# Patient Record
Sex: Male | Born: 2000 | Race: Black or African American | Hispanic: No | Marital: Single | Smoking: Never smoker
Health system: Southern US, Community
[De-identification: ages and names within clinical notes are randomized; demographics above are authoritative.]

## PROBLEM LIST (undated history)

## (undated) DIAGNOSIS — F909 Attention-deficit hyperactivity disorder, unspecified type: Secondary | ICD-10-CM

## (undated) DIAGNOSIS — F29 Unspecified psychosis not due to a substance or known physiological condition: Secondary | ICD-10-CM

## (undated) DIAGNOSIS — F99 Mental disorder, not otherwise specified: Secondary | ICD-10-CM

## (undated) HISTORY — PX: NO PAST SURGERIES: SHX2092

## (undated) HISTORY — PX: WISDOM TOOTH EXTRACTION: SHX21

---
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
## 2021-12-17 ENCOUNTER — Emergency Department (HOSPITAL_COMMUNITY): Payer: Medicaid Other

## 2021-12-17 ENCOUNTER — Inpatient Hospital Stay (HOSPITAL_COMMUNITY): Payer: Medicaid Other

## 2021-12-17 ENCOUNTER — Other Ambulatory Visit: Payer: Self-pay

## 2021-12-17 ENCOUNTER — Inpatient Hospital Stay (HOSPITAL_COMMUNITY)
Admission: EM | Admit: 2021-12-17 | Discharge: 2021-12-19 | DRG: 101 | Disposition: A | Discharge status: Home / Self Care | Payer: Medicaid Other | Attending: Internal Medicine | Admitting: Internal Medicine

## 2021-12-17 DIAGNOSIS — F29 Unspecified psychosis not due to a substance or known physiological condition: Secondary | ICD-10-CM | POA: Diagnosis present

## 2021-12-17 DIAGNOSIS — R1110 Vomiting, unspecified: Secondary | ICD-10-CM | POA: Diagnosis not present

## 2021-12-17 DIAGNOSIS — Z888 Allergy status to other drugs, medicaments and biological substances status: Secondary | ICD-10-CM

## 2021-12-17 DIAGNOSIS — R569 Unspecified convulsions: Principal | ICD-10-CM

## 2021-12-17 DIAGNOSIS — F1210 Cannabis abuse, uncomplicated: Secondary | ICD-10-CM

## 2021-12-17 DIAGNOSIS — Z818 Family history of other mental and behavioral disorders: Secondary | ICD-10-CM

## 2021-12-17 DIAGNOSIS — R270 Ataxia, unspecified: Secondary | ICD-10-CM | POA: Diagnosis present

## 2021-12-17 DIAGNOSIS — S22000A Wedge compression fracture of unspecified thoracic vertebra, initial encounter for closed fracture: Secondary | ICD-10-CM

## 2021-12-17 DIAGNOSIS — M4854XA Collapsed vertebra, not elsewhere classified, thoracic region, initial encounter for fracture: Secondary | ICD-10-CM | POA: Diagnosis present

## 2021-12-17 DIAGNOSIS — F419 Anxiety disorder, unspecified: Secondary | ICD-10-CM | POA: Diagnosis present

## 2021-12-17 DIAGNOSIS — F902 Attention-deficit hyperactivity disorder, combined type: Secondary | ICD-10-CM | POA: Diagnosis present

## 2021-12-17 DIAGNOSIS — Z79899 Other long term (current) drug therapy: Secondary | ICD-10-CM | POA: Diagnosis not present

## 2021-12-17 LAB — COMPREHENSIVE METABOLIC PANEL
ALT: 22 U/L (ref 0–44)
AST: 28 U/L (ref 15–41)
Albumin: 4.8 g/dL (ref 3.5–5.0)
Alkaline Phosphatase: 64 U/L (ref 38–126)
Anion gap: 12 (ref 5–15)
BUN: 12 mg/dL (ref 6–20)
CO2: 20 mmol/L — ABNORMAL LOW (ref 22–32)
Calcium: 9.3 mg/dL (ref 8.9–10.3)
Chloride: 104 mmol/L (ref 98–111)
Creatinine, Ser: 0.95 mg/dL (ref 0.61–1.24)
GFR, Estimated: >60 mL/min (ref >60)
Glucose, Bld: 85 mg/dL (ref 70–99)
Potassium: 3.8 mmol/L (ref 3.5–5.1)
Sodium: 136 mmol/L (ref 135–145)
Total Bilirubin: 0.7 mg/dL (ref 0.3–1.2)
Total Protein: 8.4 g/dL — ABNORMAL HIGH (ref 6.5–8.1)

## 2021-12-17 LAB — URINALYSIS, ROUTINE W REFLEX MICROSCOPIC
Bilirubin Urine: NEGATIVE
Glucose, UA: NEGATIVE mg/dL
Ketones, ur: NEGATIVE mg/dL
Leukocytes,Ua: NEGATIVE
Nitrite: NEGATIVE
Protein, ur: NEGATIVE mg/dL
Specific Gravity, Urine: 1.009 (ref 1.005–1.030)
pH: 6 (ref 5.0–8.0)

## 2021-12-17 LAB — CBC
HCT: 46.2 % (ref 39.0–52.0)
Hemoglobin: 15.2 g/dL (ref 13.0–17.0)
MCH: 30 pg (ref 26.0–34.0)
MCHC: 32.9 g/dL (ref 30.0–36.0)
MCV: 91.3 fL (ref 80.0–100.0)
Platelets: 347 10*3/uL (ref 150–400)
RBC: 5.06 MIL/uL (ref 4.22–5.81)
RDW: 12.6 % (ref 11.5–15.5)
WBC: 5.8 10*3/uL (ref 4.0–10.5)
nRBC: 0 % (ref 0.0–0.2)

## 2021-12-17 LAB — RAPID URINE DRUG SCREEN, HOSP PERFORMED
Amphetamines: NOT DETECTED
Barbiturates: NOT DETECTED
Benzodiazepines: NOT DETECTED
Cocaine: NOT DETECTED
Opiates: NOT DETECTED
Tetrahydrocannabinol: POSITIVE — AB

## 2021-12-17 LAB — CBG MONITORING, ED: Glucose-Capillary: 110 mg/dL — ABNORMAL HIGH (ref 70–99)

## 2021-12-17 LAB — LIPASE, BLOOD: Lipase: 28 U/L (ref 11–51)

## 2021-12-17 MED ORDER — IBUPROFEN 200 MG PO TABS
600.0000 mg | ORAL_TABLET | Freq: Once | ORAL | Status: AC
  Administered 2021-12-17: 600 mg via ORAL
  Filled 2021-12-17: qty 3

## 2021-12-17 MED ORDER — LORAZEPAM 2 MG/ML IJ SOLN
INTRAMUSCULAR | Status: AC
  Administered 2021-12-17: 2 mg
  Filled 2021-12-17: qty 1

## 2021-12-17 MED ORDER — ACETAMINOPHEN 325 MG PO TABS
650.0000 mg | ORAL_TABLET | Freq: Once | ORAL | Status: AC
  Administered 2021-12-17: 650 mg via ORAL
  Filled 2021-12-17: qty 2

## 2021-12-17 MED ORDER — SODIUM CHLORIDE 0.9 % IV SOLN: Freq: Once | INTRAVENOUS | Status: AC

## 2021-12-17 MED ORDER — LEVETIRACETAM 500 MG/5ML IV SOLN
60.0000 mg/kg | Freq: Once | INTRAVENOUS | Status: AC
  Administered 2021-12-17: 3400 mg via INTRAVENOUS
  Filled 2021-12-17: qty 34

## 2021-12-17 MED ORDER — LORAZEPAM 2 MG/ML IJ SOLN
1.0000 mg | INTRAMUSCULAR | Status: AC
  Administered 2021-12-17: 1 mg via INTRAVENOUS
  Filled 2021-12-17: qty 1

## 2021-12-17 NOTE — ED Notes (Signed)
Attempted to call report to IP RN at [facility]. Told by staff that there is a linen shortage and they would call back when beds are made. ?

## 2021-12-17 NOTE — ED Triage Notes (Signed)
Spoke w/pt's family. They saw pt contract and go unresponsive for approx 1 min while in chair. Pt suffered no fall When pt came to they were extremely confused. ?

## 2021-12-17 NOTE — Progress Notes (Signed)
Patient ID: Sean Calderon, male   DOB: [birth year], 21 y.o.   MRN: [id number] ?CT scan reviewed? 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  No pathologic signs on CT imaging however I do feel like maybe is worth doing an MRI scan to help stage and age these compression fractures but also the patient should be put through an extensive work-up for some kind of underlying metabolic disorder that would make him osteopenic or have poor bone density at 21 years old. ?

## 2021-12-17 NOTE — ED Notes (Signed)
[facility] called for pt. [facility] to [facility] ?

## 2021-12-17 NOTE — Progress Notes (Signed)
Reached out to CT and nurse. Patient having difficult time staying still enough for 2nd CT. EEG will attempt again once CT has finished with patient.  ?

## 2021-12-17 NOTE — H&P (Signed)
?History and Physical  ? ? ?Patient: Sean Calderon [id number] DOB: Sep 20, 2000 ?DOA: 12/17/2021 ?DOS: the patient was seen and examined on 12/17/2021 ?PCP: Jacelyn Pi, NP  ?Patient coming from: Home ? ?Chief Complaint:  ?Chief Complaint  ?Patient presents with  ? Abdominal Pain  ? Seizures  ? ?HPI: Sean Calderon is a 21 y.o. male with medical history significant of psychosis, anxiety. Presenting with altered mental status. History is from mother at bedside. She reports that this morning his brother noticed that the patient was shaking and drooling. He sounded like he was choking. The brother put the patient on his side and called for his mother. When she saw him her was shaking. He was not responsive to verbal command. He was not incontinent of bowel or bladder. She called for EMS. She reports that he has seemed more fidgety and not himself recently. He did not complain of any fevers, headache, N/V/D prior to this event.  ? ?Review of Systems: unable to review all systems due to the inability of the patient to answer questions. ?Past Medical History:  ?Diagnosis Date  ? ADHD (attention deficit hyperactivity disorder)   ? Mental disorder   ? Psychosis ([facility])   ? ?Past Surgical History:  ?Procedure Laterality Date  ? NO PAST SURGERIES    ? ?Social History:  reports that he has never smoked. He has never used smokeless tobacco. He reports that he does not drink alcohol and does not use drugs. ? ?Allergies  ?Allergen Reactions  ? Bupropion Other (See Comments)  ?  seizure  ? ? ?Family History  ?Problem Relation Age of Onset  ? Migraines Brother   ? Seizures Neg Hx   ? Depression Neg Hx   ? Anxiety disorder Neg Hx   ? ADD / ADHD Neg Hx   ? Autism Neg Hx   ? ? ?Prior to Admission medications   ?Medication Sig Start Date End Date Taking? Authorizing Provider  ?WAL-DRYL ALLERGY 25 MG tablet Take 25 mg by mouth daily as needed for itching or allergies. 09/03/21  Yes [provider]  ?b complex vitamins  tablet Take 1 tablet by mouth daily. ?Patient not taking: Reported on 08/23/2018 06/19/18   Teressa Lower, MD  ?buPROPion (WELLBUTRIN XL) 150 MG 24 hr tablet Take 150 mg by mouth every morning. 12/12/21   [provider]  ?cyproheptadine (PERIACTIN) 4 MG tablet Take 2 tablets (8 mg total) by mouth at bedtime. ?Patient not taking: Reported on 12/17/2021 12/28/18   Teressa Lower, MD  ?ibuprofen (ADVIL,MOTRIN) 100 MG/5ML suspension Take 18 mLs (360 mg total) by mouth every 6 (six) hours as needed for pain or fever. ?Patient not taking: Reported on 06/19/2018 04/16/13   Isaac Bliss, MD  ?Magnesium Oxide 500 MG TABS Take 1 tablet (500 mg total) by mouth daily. ?Patient not taking: Reported on 12/28/2018 06/19/18   Teressa Lower, MD  ? ? ?Physical Exam: ?Vitals:  ? 12/17/21 1355 12/17/21 1400 12/17/21 1430 12/17/21 1503  ?BP: 102/60 112/71 (!) 130/113 124/73  ?Pulse: 74 82 (!) 114 (!) 115  ?Resp: 18 18 18 20   ?Temp:      ?TempSrc:      ?SpO2: 98% 100% 92% 98%  ?Weight:      ?Height:      ? ?General: 21 y.o. male resting in bed in NAD ?Eyes: PERRL, normal sclera ?ENMT: Nares patent w/o discharge, orophaynx clear, dentition normal, ears w/o discharge/lesions/ulcers ?Neck: Supple, trachea midline ?Cardiovascular: RRR, +S1, S2, no m/g/r, equal  pulses throughout ?Respiratory: CTABL, no w/r/r, normal WOB ?GI: BS+, NDNT, no masses noted, no organomegaly noted ?MSK: No e/c/c ?Neuro: responsive to noxious stim, drowsy, not following directions ? ?Data Reviewed: ? ?Na+  136 ?Glucose  85 ?CO2  20 ?WBC  5.8 ? ?CTH: No acute intracranial findings are seen in noncontrast CT brain. ?CT thoracic spine: Age-indeterminate but not definitely acute multiple compression fractures. Height loss is greatest at T6 and T7 measuring about 50%. There is no significant osseous retropulsion. ? ?EKG: sinus, no st elevations ? ?Assessment and Plan: ?No notes have been filed under this hospital service. ?Service: Hospitalist ?Seizures ?    -  admit to inpt, tele @ [facility] ?    - chart review shows he had a presumed seizure back in 2019; this was followed up with pediatric neuro at the time and he was Dx with tension type headaches and syncope ?    - he's on welbutrin for anxiety; this could contribute ?    - EEG pending ?    - neurology consulted; appreciate assistance ?    - keppra ordered; defer further dosing and anti-epileptics to neurology ?    - check MRI brain ? ??compression fracture of T6-T7 ?    - as noted on CT ?    - neurosurgery has reviewed; no acute neurosurgical intervention required; although they question why his bones are so thin at 21 years of age ?    - check vitamin d levels ? ?ADHD ?Psychosis ?    - holding current regimen ? ?Marijuana abuse ?    - counsel against further use ? ?Advance Care Planning:   Code Status: FULL ? ?Consults: Neurology, Neurosurgery ? ?Family Communication: w/ mother at bedside ? ?Severity of Illness: ?The appropriate patient status for this patient is INPATIENT. Inpatient status is judged to be reasonable and necessary in order to provide the required intensity of service to ensure the patient's safety. The patient's presenting symptoms, physical exam findings, and initial radiographic and laboratory data in the context of their chronic comorbidities is felt to place them at high risk for further clinical deterioration. Furthermore, it is not anticipated that the patient will be medically stable for discharge from the hospital within 2 midnights of admission.  ? ?* I certify that at the point of admission it is my clinical judgment that the patient will require inpatient hospital care spanning beyond 2 midnights from the point of admission due to high intensity of service, high risk for further deterioration and high frequency of surveillance required.* ? ?Author: ?Jonnie Finner, DO ?12/17/2021 3:51 PM ? ?For on call review [email or web address].  ?

## 2021-12-17 NOTE — ED Provider Notes (Signed)
?[facility] DEPT ?Provider Note ? ? ?CSN: [id number] ?Arrival date & time: 12/17/21  1018 ? ?  ? ?History ? ?Chief Complaint  ?Patient presents with  ? Abdominal Pain  ? Seizures  ? ? ?Sean Calderon is a 21 y.o. male. ? ?HPI ?21 year old male presents with a seizure.  History initially from the patient but he does remember what happened and so I discussed with his mom who notes that the patient had a witnessed tonic-clonic seizure for about 5 minutes.  His brother noted the patient started acting odd and then started shaking and stiffening.  When he first awoke he was confused.  The patient remembers sitting on the couch with his earbuds in and then waking up in the ambulance.  Since being in the ambulance he is having some back and abdominal pain whenever he moves but if he stays still he feels fine.  No prior history of seizures according to the patient and mom.  He does have a prior history of chronic headaches though this is improving.  Nurse note from EMS indicates a severe headache but the patient denies having a headache this morning. ? ?He has been sleeping very poorly over the last week or so.  He has had a lot of stress due to college.  He has been on Wellbutrin since about November for anxiety. No recent illness. ? ?Home Medications ?Prior to Admission medications   ?Medication Sig Start Date End Date Taking? Authorizing Provider  ?WAL-DRYL ALLERGY 25 MG tablet Take 25 mg by mouth daily as needed for itching or allergies. 09/03/21  Yes [provider]  ?b complex vitamins tablet Take 1 tablet by mouth daily. ?Patient not taking: Reported on 08/23/2018 06/19/18   Teressa Lower, MD  ?buPROPion (WELLBUTRIN XL) 150 MG 24 hr tablet Take 150 mg by mouth every morning. 12/12/21   [provider]  ?cyproheptadine (PERIACTIN) 4 MG tablet Take 2 tablets (8 mg total) by mouth at bedtime. ?Patient not taking: Reported on 12/17/2021 12/28/18   Teressa Lower, MD  ?ibuprofen  (ADVIL,MOTRIN) 100 MG/5ML suspension Take 18 mLs (360 mg total) by mouth every 6 (six) hours as needed for pain or fever. ?Patient not taking: Reported on 06/19/2018 04/16/13   Isaac Bliss, MD  ?Magnesium Oxide 500 MG TABS Take 1 tablet (500 mg total) by mouth daily. ?Patient not taking: Reported on 12/28/2018 06/19/18   Teressa Lower, MD  ?   ? ?Allergies    ?Bupropion   ? ?Review of Systems   ?Review of Systems  ?Constitutional:  Negative for fever.  ?Respiratory:  Negative for shortness of breath.   ?Cardiovascular:  Negative for chest pain.  ?Gastrointestinal:  Positive for abdominal pain. Negative for nausea and vomiting.  ?Musculoskeletal:  Positive for back pain.  ?Neurological:  Positive for seizures. Negative for weakness, numbness and headaches.  ? ?Physical Exam ?Updated Vital Signs ?BP 124/73   Pulse (!) 115   Temp 98.6 ?F (37 ?C) (Oral)   Resp 20   Ht 5\' 4"  (1.626 m)   Wt 56.7 kg   SpO2 98%   BMI 21.46 kg/m?  ?Physical Exam ?Vitals and nursing note reviewed.  ?Constitutional:   ?   Appearance: He is well-developed.  ?HENT:  ?   Head: Normocephalic and atraumatic.  ?Eyes:  ?   Extraocular Movements: Extraocular movements intact.  ?   Pupils: Pupils are equal, round, and reactive to light.  ?Cardiovascular:  ?   Rate and Rhythm: Normal  rate and regular rhythm.  ?   Heart sounds: Normal heart sounds.  ?Pulmonary:  ?   Effort: Pulmonary effort is normal.  ?   Breath sounds: Normal breath sounds.  ?Abdominal:  ?   Palpations: Abdomen is soft.  ?   Tenderness: There is no abdominal tenderness.  ?Musculoskeletal:  ?   Cervical back: Normal range of motion. No rigidity.  ?   Comments: Patient reports midthoracic back pain when I sit him up but I am unable to reproduce this.  He also has abdominal discomfort upon sitting up but I also cannot reproduce this.  No CVA tenderness.  ?Skin: ?   General: Skin is warm and dry.  ?Neurological:  ?   Mental Status: He is alert and oriented to person, place, and  time.  ?   Comments: CN 3-12 grossly intact. 5/5 strength in all 4 extremities. Grossly normal sensation. Normal finger to nose.   ? ? ?ED Results / Procedures / Treatments   ?Labs ?(all labs ordered are listed, but only abnormal results are displayed) ?Labs Reviewed  ?COMPREHENSIVE METABOLIC PANEL - Abnormal; Notable for the following components:  ?    Result Value  ? CO2 20 (*)   ? Total Protein 8.4 (*)   ? All other components within normal limits  ?URINALYSIS, ROUTINE W REFLEX MICROSCOPIC - Abnormal; Notable for the following components:  ? Color, Urine STRAW (*)   ? Hgb urine dipstick SMALL (*)   ? Bacteria, UA RARE (*)   ? All other components within normal limits  ?RAPID URINE DRUG SCREEN, HOSP PERFORMED - Abnormal; Notable for the following components:  ? Tetrahydrocannabinol POSITIVE (*)   ? All other components within normal limits  ?CBG MONITORING, ED - Abnormal; Notable for the following components:  ? Glucose-Capillary 110 (*)   ? All other components within normal limits  ?LIPASE, BLOOD  ?CBC  ? ? ?EKG ?EKG Interpretation ? ?Date/Time:  Friday December 17 2021 11:31:43 EDT ?Ventricular Rate:  82 ?PR Interval:  172 ?QRS Duration: 83 ?QT Interval:  355 ?QTC Calculation: 415 ?R Axis:   72 ?Text Interpretation: Sinus rhythm LVH by voltage no significant change since 2014 Confirmed by Sherwood Gambler [phone]) on 12/17/2021 12:51:45 PM ? ?Radiology ?DG Thoracic Spine W/Swimmers ? ?Result Date: 12/17/2021 ?CLINICAL DATA:  Fall, upper abdominal pain radiating to the back EXAM: THORACIC SPINE - 3 VIEWS COMPARISON:  None. FINDINGS: Mild vertebral body height loss of 2 midthoracic vertebral segments, approximately T6 and T7. Normal alignment. Disc heights are preserved. IMPRESSION: Mild vertebral body height loss of two adjacent midthoracic vertebral segments, approximately T6 and T7. Correlate with point tenderness. Electronically Signed   By: [name] D.O.   On: 12/17/2021 13:56  ? ?CT Head Wo  Contrast ? ?Result Date: 12/17/2021 ?CLINICAL DATA:  Seizures EXAM: CT HEAD WITHOUT CONTRAST TECHNIQUE: Contiguous axial images were obtained from the base of the skull through the vertex without intravenous contrast. RADIATION DOSE REDUCTION: This exam was performed according to the departmental dose-optimization program which includes automated exposure control, adjustment of the mA and/or kV according to patient size and/or use of iterative reconstruction technique. COMPARISON:  Images of previous study done on 05/23/2018 are not available for comparison at the time of this report. Report for the previous study was reviewed. FINDINGS: Brain: No acute intracranial findings are seen. Ventricles are not dilated. There is no shift of midline structures. There are no epidural or subdural fluid collections. Vascular: Unremarkable. Skull: Unremarkable.  Sinuses/Orbits: There is mild mucosal thickening in the ethmoid sinus. Other: None IMPRESSION: No acute intracranial findings are seen in noncontrast CT brain. Electronically Signed   By: Elmer Picker M.D.   On: 12/17/2021 15:04  ? ?DG Chest Portable 1 View ? ?Result Date: 12/17/2021 ?CLINICAL DATA:  Chest pain EXAM: PORTABLE CHEST 1 VIEW COMPARISON:  Chest x-ray 04/16/2013 FINDINGS: Heart size and mediastinal contours are within normal limits. No suspicious pulmonary opacities identified. No pleural effusion or pneumothorax visualized. No acute osseous abnormality appreciated. IMPRESSION: No acute intrathoracic process identified. Electronically Signed   By: Ofilia Neas M.D.   On: 12/17/2021 12:12   ? ?Procedures ?Marland KitchenCritical Care ?Performed by: Sherwood Gambler, MD ?Authorized by: Sherwood Gambler, MD  ? ?Critical care provider statement:  ?  Critical care time (minutes):  30 ?  Critical care time was exclusive of:  Separately billable procedures and treating other patients ?  Critical care was necessary to treat or prevent imminent or life-threatening  deterioration of the following conditions:  CNS failure or compromise ?  Critical care was time spent personally by me on the following activities:  Development of treatment plan with patient or surrogate, discussions with consultant

## 2021-12-17 NOTE — ED Triage Notes (Signed)
Pt BIBA from home. Per EMS pt c/o severe HA, then fell onto couch and began being really confused, not knowing what was going on. Pupils = and reactive. In EMS pt began c/o severe upper abd px that radiates to back. ? ?Hx of THS use. ? ?Upon arrival pt Aox4, and no c/o HA ? ?

## 2021-12-17 NOTE — ED Notes (Signed)
Report called to Ebony Cargo, Charity fundraiser at [facility]. ?

## 2021-12-17 NOTE — Progress Notes (Signed)
Pt unable to cooperate w EEG. Tech had leads on multiple times and patient was rolling in bed, mom attempted to help restrain but patient was able to still move. EEG unsuccessful. Dr. Roda Shutters notified.  ?

## 2021-12-17 NOTE — ED Provider Notes (Signed)
Results for orders placed or performed during the hospital encounter of 12/17/21  ?Lipase, blood  ?Result Value Ref Range  ? Lipase 28 11 - 51 U/L  ?Comprehensive metabolic panel  ?Result Value Ref Range  ? Sodium 136 135 - 145 mmol/L  ? Potassium 3.8 3.5 - 5.1 mmol/L  ? Chloride 104 98 - 111 mmol/L  ? CO2 20 (L) 22 - 32 mmol/L  ? Glucose, Bld 85 70 - 99 mg/dL  ? BUN 12 6 - 20 mg/dL  ? Creatinine, Ser 0.95 0.61 - 1.24 mg/dL  ? Calcium 9.3 8.9 - 10.3 mg/dL  ? Total Protein 8.4 (H) 6.5 - 8.1 g/dL  ? Albumin 4.8 3.5 - 5.0 g/dL  ? AST 28 15 - 41 U/L  ? ALT 22 0 - 44 U/L  ? Alkaline Phosphatase 64 38 - 126 U/L  ? Total Bilirubin 0.7 0.3 - 1.2 mg/dL  ? GFR, Estimated >60 >60 mL/min  ? Anion gap 12 5 - 15  ?CBC  ?Result Value Ref Range  ? WBC 5.8 4.0 - 10.5 K/uL  ? RBC 5.06 4.22 - 5.81 MIL/uL  ? Hemoglobin 15.2 13.0 - 17.0 g/dL  ? HCT 46.2 39.0 - 52.0 %  ? MCV 91.3 80.0 - 100.0 fL  ? MCH 30.0 26.0 - 34.0 pg  ? MCHC 32.9 30.0 - 36.0 g/dL  ? RDW 12.6 11.5 - 15.5 %  ? Platelets 347 150 - 400 K/uL  ? nRBC 0.0 0.0 - 0.2 %  ?Urinalysis, Routine w reflex microscopic  ?Result Value Ref Range  ? Color, Urine STRAW (A) YELLOW  ? APPearance CLEAR CLEAR  ? Specific Gravity, Urine 1.009 1.005 - 1.030  ? pH 6.0 5.0 - 8.0  ? Glucose, UA NEGATIVE NEGATIVE mg/dL  ? Hgb urine dipstick SMALL (A) NEGATIVE  ? Bilirubin Urine NEGATIVE NEGATIVE  ? Ketones, ur NEGATIVE NEGATIVE mg/dL  ? Protein, ur NEGATIVE NEGATIVE mg/dL  ? Nitrite NEGATIVE NEGATIVE  ? Leukocytes,Ua NEGATIVE NEGATIVE  ? RBC / HPF 0-5 0 - 5 RBC/hpf  ? WBC, UA 0-5 0 - 5 WBC/hpf  ? Bacteria, UA RARE (A) NONE SEEN  ? Mucus PRESENT   ? Hyaline Casts, UA PRESENT   ?Rapid urine drug screen (hospital performed)  ?Result Value Ref Range  ? Opiates NONE DETECTED NONE DETECTED  ? Cocaine NONE DETECTED NONE DETECTED  ? Benzodiazepines NONE DETECTED NONE DETECTED  ? Amphetamines NONE DETECTED NONE DETECTED  ? Tetrahydrocannabinol POSITIVE (A) NONE DETECTED  ? Barbiturates NONE DETECTED NONE  DETECTED  ?CBG monitoring, ED  ?Result Value Ref Range  ? Glucose-Capillary 110 (H) 70 - 99 mg/dL  ? ?DG Thoracic Spine W/Swimmers ? ?Result Date: 12/17/2021 ?CLINICAL DATA:  Fall, upper abdominal pain radiating to the back EXAM: THORACIC SPINE - 3 VIEWS COMPARISON:  None. FINDINGS: Mild vertebral body height loss of 2 midthoracic vertebral segments, approximately T6 and T7. Normal alignment. Disc heights are preserved. IMPRESSION: Mild vertebral body height loss of two adjacent midthoracic vertebral segments, approximately T6 and T7. Correlate with point tenderness. Electronically Signed   By: Duanne Guess D.O.   On: 12/17/2021 13:56  ? ?CT Head Wo Contrast ? ?Result Date: 12/17/2021 ?CLINICAL DATA:  Seizures EXAM: CT HEAD WITHOUT CONTRAST TECHNIQUE: Contiguous axial images were obtained from the base of the skull through the vertex without intravenous contrast. RADIATION DOSE REDUCTION: This exam was performed according to the departmental dose-optimization program which includes automated exposure control, adjustment of the mA and/or kV  according to patient size and/or use of iterative reconstruction technique. COMPARISON:  Images of previous study done on 05/23/2018 are not available for comparison at the time of this report. Report for the previous study was reviewed. FINDINGS: Brain: No acute intracranial findings are seen. Ventricles are not dilated. There is no shift of midline structures. There are no epidural or subdural fluid collections. Vascular: Unremarkable. Skull: Unremarkable. Sinuses/Orbits: There is mild mucosal thickening in the ethmoid sinus. Other: None IMPRESSION: No acute intracranial findings are seen in noncontrast CT brain. Electronically Signed   By: Ernie Avena M.D.   On: 12/17/2021 15:04  ? ?CT Thoracic Spine Wo Contrast ? ?Result Date: 12/17/2021 ?CLINICAL DATA:  Back trauma, no prior imaging (Age >= 16y) EXAM: CT THORACIC SPINE WITHOUT CONTRAST TECHNIQUE: Multidetector CT  images of the thoracic were obtained using the standard protocol without intravenous contrast. RADIATION DOSE REDUCTION: This exam was performed according to the departmental dose-optimization program which includes automated exposure control, adjustment of the mA and/or kV according to patient size and/or use of iterative reconstruction technique. COMPARISON:  None. FINDINGS: Alignment: No significant listhesis. Vertebrae: There are multilevel compression fractures. There is moderate, about 50% loss of height at the superior endplates of T6 and T7. Variable but less than 50% loss of height is present at the superior endplates of T3, T4, T5, T8, T9, and T10. These are age-indeterminate. There is no significant osseous retropulsion. No destructive osseous lesion. Paraspinal and other soft tissues: Unremarkable. Disc levels: Intervertebral disc heights are maintained. There is no significant canal or foraminal narrowing identified. IMPRESSION: Age-indeterminate but not definitely acute multiple compression fractures. Height loss is greatest at T6 and T7 measuring about 50%. There is no significant osseous retropulsion. Electronically Signed   By: Guadlupe Spanish M.D.   On: 12/17/2021 15:54  ? ?DG Chest Portable 1 View ? ?Result Date: 12/17/2021 ?CLINICAL DATA:  Chest pain EXAM: PORTABLE CHEST 1 VIEW COMPARISON:  Chest x-ray 04/16/2013 FINDINGS: Heart size and mediastinal contours are within normal limits. No suspicious pulmonary opacities identified. No pleural effusion or pneumothorax visualized. No acute osseous abnormality appreciated. IMPRESSION: No acute intrathoracic process identified. Electronically Signed   By: Jannifer Hick M.D.   On: 12/17/2021 12:12   ? ?Discussed with Dr. Wynetta Emery from neurosurgery.  Is not sure what to make of this.  But he is very concerned about some sort of metabolic or endocrine abnormality with this person recommends work-up for bone density may be also recommend may be getting MRI  may be sed rate etc. we will discussed with the hospitalist admitting team seems to be mostly of a medical problem.  Neurosurgery says the findings are very concerning for some bite is 21 years old.  But does not see anything acute that they need to do anything with at this time. ?  ?Vanetta Mulders, MD ?12/17/21 1625 ? ?

## 2021-12-18 ENCOUNTER — Inpatient Hospital Stay (HOSPITAL_COMMUNITY): Payer: Medicaid Other

## 2021-12-18 ENCOUNTER — Encounter (HOSPITAL_COMMUNITY): Payer: Self-pay | Admitting: Internal Medicine

## 2021-12-18 DIAGNOSIS — R270 Ataxia, unspecified: Secondary | ICD-10-CM

## 2021-12-18 LAB — COMPREHENSIVE METABOLIC PANEL
ALT: 24 U/L (ref 0–44)
AST: 36 U/L (ref 15–41)
Albumin: 4.3 g/dL (ref 3.5–5.0)
Alkaline Phosphatase: 63 U/L (ref 38–126)
Anion gap: 9 (ref 5–15)
BUN: 8 mg/dL (ref 6–20)
CO2: 22 mmol/L (ref 22–32)
Calcium: 9.1 mg/dL (ref 8.9–10.3)
Chloride: 107 mmol/L (ref 98–111)
Creatinine, Ser: 1.06 mg/dL (ref 0.61–1.24)
GFR, Estimated: >60 mL/min (ref >60)
Glucose, Bld: 63 mg/dL — ABNORMAL LOW (ref 70–99)
Potassium: 3.4 mmol/L — ABNORMAL LOW (ref 3.5–5.1)
Sodium: 138 mmol/L (ref 135–145)
Total Bilirubin: 1.1 mg/dL (ref 0.3–1.2)
Total Protein: 7.4 g/dL (ref 6.5–8.1)

## 2021-12-18 LAB — VITAMIN D 25 HYDROXY (VIT D DEFICIENCY, FRACTURES): Vit D, 25-Hydroxy: 29.93 ng/mL — ABNORMAL LOW (ref 30–100)

## 2021-12-18 LAB — VITAMIN B12: Vitamin B-12: 996 pg/mL — ABNORMAL HIGH (ref 180–914)

## 2021-12-18 LAB — CBC
HCT: 41.9 % (ref 39.0–52.0)
Hemoglobin: 14.2 g/dL (ref 13.0–17.0)
MCH: 30 pg (ref 26.0–34.0)
MCHC: 33.9 g/dL (ref 30.0–36.0)
MCV: 88.6 fL (ref 80.0–100.0)
Platelets: 338 10*3/uL (ref 150–400)
RBC: 4.73 MIL/uL (ref 4.22–5.81)
RDW: 12.5 % (ref 11.5–15.5)
WBC: 6.2 10*3/uL (ref 4.0–10.5)
nRBC: 0 % (ref 0.0–0.2)

## 2021-12-18 LAB — FOLATE: Folate: 28.3 ng/mL (ref >5.9)

## 2021-12-18 LAB — HIV ANTIBODY (ROUTINE TESTING W REFLEX): HIV Screen 4th Generation wRfx: NONREACTIVE

## 2021-12-18 MED ORDER — ONDANSETRON HCL 4 MG PO TABS
4.0000 mg | ORAL_TABLET | Freq: Four times a day (QID) | ORAL | Status: DC | PRN
  Administered 2021-12-18: 4 mg via ORAL
  Filled 2021-12-18: qty 1

## 2021-12-18 MED ORDER — ORAL CARE MOUTH RINSE
15.0000 mL | OROMUCOSAL | Status: DC
  Administered 2021-12-18 – 2021-12-19 (×8): 15 mL via OROMUCOSAL

## 2021-12-18 MED ORDER — CHLORHEXIDINE GLUCONATE 0.12% ORAL RINSE (MEDLINE KIT)
15.0000 mL | Freq: Two times a day (BID) | OROMUCOSAL | Status: DC
  Administered 2021-12-18 – 2021-12-19 (×2): 15 mL via OROMUCOSAL

## 2021-12-18 MED ORDER — OYSTER SHELL CALCIUM/D3 500-5 MG-MCG PO TABS
1.0000 | ORAL_TABLET | Freq: Every day | ORAL | Status: DC
  Administered 2021-12-18 – 2021-12-19 (×2): 1 via ORAL
  Filled 2021-12-18 (×2): qty 1

## 2021-12-18 MED ORDER — LEVETIRACETAM 500 MG PO TABS
500.0000 mg | ORAL_TABLET | Freq: Two times a day (BID) | ORAL | Status: DC
  Administered 2021-12-18 – 2021-12-19 (×3): 500 mg via ORAL
  Filled 2021-12-18 (×3): qty 1

## 2021-12-18 MED ORDER — ONDANSETRON HCL 4 MG/2ML IJ SOLN: 4.0000 mg | Freq: Four times a day (QID) | INTRAMUSCULAR | Status: DC | PRN

## 2021-12-18 MED ORDER — ACETAMINOPHEN 650 MG RE SUPP: 650.0000 mg | Freq: Four times a day (QID) | RECTAL | Status: DC | PRN

## 2021-12-18 MED ORDER — DIPHENHYDRAMINE HCL 50 MG/ML IJ SOLN
25.0000 mg | Freq: Once | INTRAMUSCULAR | Status: AC
  Administered 2021-12-18: 25 mg via INTRAVENOUS
  Filled 2021-12-18: qty 1

## 2021-12-18 MED ORDER — ACETAMINOPHEN 325 MG PO TABS
650.0000 mg | ORAL_TABLET | Freq: Four times a day (QID) | ORAL | Status: DC | PRN
  Administered 2021-12-18: 650 mg via ORAL
  Filled 2021-12-18: qty 2

## 2021-12-18 MED ORDER — OYSTER SHELL CALCIUM/D3 500-5 MG-MCG PO TABS: 1.0000 | ORAL_TABLET | Freq: Every day | ORAL | Status: DC

## 2021-12-18 MED ORDER — KETOROLAC TROMETHAMINE 30 MG/ML IJ SOLN
30.0000 mg | Freq: Four times a day (QID) | INTRAMUSCULAR | Status: DC
  Administered 2021-12-18 – 2021-12-19 (×3): 30 mg via INTRAVENOUS
  Filled 2021-12-18 (×7): qty 1

## 2021-12-18 MED ORDER — PROCHLORPERAZINE EDISYLATE 10 MG/2ML IJ SOLN: 10.0000 mg | Freq: Four times a day (QID) | INTRAMUSCULAR | Status: DC | PRN

## 2021-12-18 MED ORDER — ACETAMINOPHEN 500 MG PO TABS
1000.0000 mg | ORAL_TABLET | Freq: Four times a day (QID) | ORAL | Status: DC
  Administered 2021-12-18 – 2021-12-19 (×3): 1000 mg via ORAL
  Filled 2021-12-18 (×4): qty 2

## 2021-12-18 NOTE — Hospital Course (Signed)
Mr. Mofield is a 21 yo male with PMH depression/anxiety , ADHD who presented with altered mentation.  He was unable to provide collateral information on admission and history was provided by his mother.  It was reported that he was noted to be shaking and drooling.  He was not responsive to verbal stimuli.  There was no incontinence of bowel or bladder.  He appears to have a presumed seizure back in 2019 and at that time was diagnosed with tension headaches.  He also has been on Wellbutrin prior to admission.  He underwent work-up and neurology evaluation.  He was recommended to be discontinued off of Wellbutrin and bridged with a 1 week course of Keppra. ?With further work-up he was also found to have a compression fracture of T6-T7.  He was evaluated by neurosurgery with no recommendations for surgical intervention.  Vitamin D level was mildly low and he was started on supplementation.  He was recommended to have close outpatient follow-up with primary care for any further work-up needed. ?

## 2021-12-18 NOTE — Assessment & Plan Note (Addendum)
-   Remains very ataxic with unsteady gait.  Possibly due to multiple medications received on admission and recovering from presumed seizure. ?- ataxia is also listed as CNS side effect of keppra; will discuss with neurology; not considered contributory given low dose ?- checking B12 (996), folate (28.3), B1 ?-No cerebellar stroke appreciated on MRI brain ?- resolved prior to discharge and worked well with PT ?

## 2021-12-18 NOTE — Procedures (Signed)
Routine EEG Report ? ?Sean Calderon is a 21 y.o. male with a history of seizure who is undergoing an EEG to evaluate for seizures. ? ?Report: This EEG was acquired with electrodes placed according to the International 10-20 electrode system (including Fp1, Fp2, F3, F4, C3, C4, P3, P4, O1, O2, T3, T4, T5, T6, A1, A2, Fz, Cz, Pz). The following electrodes were missing or displaced: none. ? ?The occipital dominant rhythm was 7 Hz. This activity is reactive to stimulation. Drowsiness was manifested by background fragmentation; deeper stages of sleep were identified by K complexes and sleep spindles. There was no focal slowing. There were no interictal epileptiform discharges. There were no electrographic seizures identified. Photic stimulation and hyperventilation were not performed. ? ?Impression and clinical correlation: This EEG was obtained while awake and asleep and is abnormal due to mild diffuse slowing indicative of global cerebral dysfunction. Epileptiform abnormalities were not seen during this recording. ? ?Bing Neighbors, MD ?[facility] ?[phone] ? ?If 7pm- 7am, please page neurology on call as listed in AMION. ? ?

## 2021-12-18 NOTE — Progress Notes (Signed)
PT Cancellation Note ? ?Patient Details ?Name: Sean Calderon ?MRN: [id number] ?DOB: March 29, 2001 ? ? ?Cancelled Treatment:    Reason Eval/Treat Not Completed: Other (comment) ? ?On arrival, pt sleeping soundly. Mother present and reports pt tried to walk twice today and both times he ended up vomiting large amount. She just got him settled and sleeping and asked PT to return tomorrow morning. Will attempt to see early as noted ?discharge planned for 4/30.  ? ? ?Jerolyn Center, PT ?Acute Rehabilitation Services  ?Pager [phone] ?Office [phone] ? ?Scherrie November Angeliki Mates ?12/18/2021, 3:37 PM ?

## 2021-12-18 NOTE — Discharge Instructions (Signed)
Seizure precautions: °Per [place] DMV statutes, patients with seizures are not allowed to drive until they have been seizure-free for six months and cleared by a physician  °  °Use caution when using heavy equipment or power tools. Avoid working on ladders or at heights. Take showers instead of baths. Ensure the water temperature is not too high on the home water heater. Do not go swimming alone. Do not lock yourself in a room alone (i.e. bathroom). When caring for infants or small children, sit down when holding, feeding, or changing them to minimize risk of injury to the child in the event you have a seizure. Maintain good sleep hygiene. Avoid alcohol.  °  °If patient has another seizure, call 911 and bring them back to the ED if: °A.  The seizure lasts longer than 5 minutes.      °B.  The patient doesn't wake shortly after the seizure or has new problems such as difficulty seeing, speaking or moving following the seizure °C.  The patient was injured during the seizure °D.  The patient has a temperature over 102 F (39C) °E.  The patient vomited during the seizure and now is having trouble breathing °   °During the Seizure °  °- First, ensure adequate ventilation and place patients on the floor on their left side  °Loosen clothing around the neck and ensure the airway is patent. If the patient is clenching the teeth, do not force the mouth open with any object as this can cause severe damage °- Remove all items from the surrounding that can be hazardous. The patient may be oblivious to what's happening and may not even know what he or she is doing. °If the patient is confused and wandering, either gently guide him/her away and block access to outside areas °- Reassure the individual and be comforting °- Call 911. In most cases, the seizure ends before EMS arrives. However, there are cases when seizures may last over 3 to 5 minutes. Or the individual may have developed breathing difficulties or severe  injuries. If a pregnant patient or a person with diabetes develops a seizure, it is prudent to call an ambulance. °- Finally, if the patient does not regain full consciousness, then call EMS. Most patients will remain confused for about 45 to 90 minutes after a seizure, so you must use judgment in calling for help. °- Avoid restraints but make sure the patient is in a bed with padded side rails °- Place the individual in a lateral position with the neck slightly flexed; this will help the saliva drain from the mouth and prevent the tongue from falling backward °- Remove all nearby furniture and other hazards from the area °- Provide verbal assurance as the individual is regaining consciousness °- Provide the patient with privacy if possible °- Call for help and start treatment as ordered by the caregiver °  ° After the Seizure (Postictal Stage) °  °After a seizure, most patients experience confusion, fatigue, muscle pain and/or a headache. Thus, one should permit the individual to sleep. For the next few days, reassurance is essential. Being calm and helping reorient the person is also of importance. °  °Most seizures are painless and end spontaneously. Seizures are not harmful to others but can lead to complications such as stress on the lungs, brain and the heart. Individuals with prior lung problems may develop labored breathing and respiratory distress.  °  °

## 2021-12-18 NOTE — Progress Notes (Signed)
EEG complete - results pending 

## 2021-12-18 NOTE — Assessment & Plan Note (Addendum)
-   possibly from a fall unwitnessed or during seizure? Patient unaware of how he may have fallen ?-No intervention recommended by neurosurgery ?- Pain is minimal ?- Patient recommended to start on calcium and vitamin D supplementation ?-He is recommended to have outpatient work-up for further investigation regarding underlying osteopenia or poor bone density ?

## 2021-12-18 NOTE — Progress Notes (Signed)
?Progress Note ? ? ? ?Sean Calderon   ?XNA:[id number]  ?DOB: 15-Oct-2000  ?DOA: 12/17/2021     1 ?PCP: Tammi Sou, NP ? ?Initial CC: AMS ? ?Hospital Course: ?Sean Calderon is a 21 yo male with PMH depression/anxiety , ADHD who presented with altered mentation.  He was unable to provide collateral information on admission and history was provided by his mother.  It was reported that he was noted to be shaking and drooling.  He was not responsive to verbal stimuli.  There was no incontinence of bowel or bladder.  He appears to have a presumed seizure back in 2019 and at that time was diagnosed with tension headaches.  He also has been on Wellbutrin prior to admission.  He underwent work-up and neurology evaluation.  He was recommended to be discontinued off of Wellbutrin and bridged with a 1 week course of Keppra. ?With further work-up he was also found to have a compression fracture of T6-T7.  He was evaluated by neurosurgery with no recommendations for surgical intervention.  Vitamin D level was mildly low and he was started on supplementation.  He was recommended to have close outpatient follow-up with primary care for any further work-up needed. ? ?Interval History:  ?Seen in his room this morning with mom and his older brother present bedside.  His mentation was much improved compared to admission and he was speaking clearly and coherently. ?However, he continues to have severe ataxia and is not stable on his feet.  We discussed that he is not safe for discharging home until able to ambulate safely. ? ?Assessment and Plan: ?* Seizure (HCC) ?- Evaluated by neurology on admission due to concern for seizure activity ?- He was suspected to have seizures provoked by Wellbutrin use which has been discontinued ?- He continues on Keppra 500 mg twice daily for 1 week while Wellbutrin is stopped ?- Valtoco 7.5 mg each nostril for total of 15 mg recommended per neurology for generalized tonic-clonic seizure lasting more  than 5 minutes.  Explained to mother who understands. ?- Outpatient follow-up with neurology recommended ?- Patient has been instructed no driving for 6 months and seizure-free before resuming ?- longterm AED not recommended unless recurrent seizures after stopping Wellbutrin  ? ?Compression fracture of body of thoracic vertebra (HCC) ?- possibly from a fall unwitnessed or during seizure? Patient unaware of how he may have fallen ?-No intervention recommended by neurosurgery ?- Pain is minimal ?- Patient recommended to start on calcium and vitamin D supplementation ?-He is recommended to have outpatient work-up for further investigation regarding underlying osteopenia or poor bone density ? ?Ataxia ?- Remains very ataxic with unsteady gait.  Possibly due to multiple medications received on admission and recovering from presumed seizure. ?- ataxia is also listed as CNS side effect of keppra; will discuss with neurology; not considered contributory given low dose ?- checking B12, folate, B1 ?-No cerebellar stroke appreciated on MRI brain ?- will also have PT help evaluate ? ?Marijuana abuse ?- cessation counseling provided  ? ? ?Old records reviewed in assessment of this patient ? ?Antimicrobials: ? ? ?DVT prophylaxis:  ?SCDs Start: 12/18/21 0109 ? ? ?Code Status:   Code Status: Full Code ? ?Disposition Plan:  Home possibly Sunday ?Status is: Inpt ? ?Objective: ?Blood pressure 103/82, pulse 98, temperature 98.3 ?F (36.8 ?C), temperature source Oral, resp. rate 18, height 5\' 4"  (1.626 m), weight 56.7 kg, SpO2 98 %.  ?Examination:  ?Physical Exam ?Constitutional:   ?   General:  He is not in acute distress. ?   Appearance: Normal appearance.  ?   Comments: Thin  ?HENT:  ?   Head: Normocephalic and atraumatic.  ?   Mouth/Throat:  ?   Mouth: Mucous membranes are moist.  ?Eyes:  ?   Extraocular Movements: Extraocular movements intact.  ?Cardiovascular:  ?   Rate and Rhythm: Normal rate and regular rhythm.  ?   Heart sounds:  Normal heart sounds.  ?Pulmonary:  ?   Effort: Pulmonary effort is normal. No respiratory distress.  ?   Breath sounds: Normal breath sounds. No wheezing.  ?Abdominal:  ?   General: Bowel sounds are normal. There is no distension.  ?   Palpations: Abdomen is soft.  ?   Tenderness: There is no abdominal tenderness.  ?Musculoskeletal:     ?   General: Normal range of motion.  ?   Cervical back: Normal range of motion and neck supple.  ?Skin: ?   General: Skin is warm and dry.  ?Neurological:  ?   Mental Status: He is alert and oriented to person, place, and time.  ?   Gait: Gait abnormal.  ?Psychiatric:     ?   Mood and Affect: Mood normal.     ?   Behavior: Behavior normal.  ?  ? ?Consultants:  ?Neurology ? ?Procedures:  ? ? ?Data Reviewed: ?Results for orders placed or performed during the hospital encounter of 12/17/21 (from the past 24 hour(s))  ?CBG monitoring, ED     Status: Abnormal  ? Collection Time: 12/17/21  2:28 PM  ?Result Value Ref Range  ? Glucose-Capillary 110 (H) 70 - 99 mg/dL  ?HIV Antibody (routine testing w rflx)     Status: None  ? Collection Time: 12/18/21  1:20 AM  ?Result Value Ref Range  ? HIV Screen 4th Generation wRfx Non Reactive Non Reactive  ?Comprehensive metabolic panel     Status: Abnormal  ? Collection Time: 12/18/21  1:20 AM  ?Result Value Ref Range  ? Sodium 138 135 - 145 mmol/L  ? Potassium 3.4 (L) 3.5 - 5.1 mmol/L  ? Chloride 107 98 - 111 mmol/L  ? CO2 22 22 - 32 mmol/L  ? Glucose, Bld 63 (L) 70 - 99 mg/dL  ? BUN 8 6 - 20 mg/dL  ? Creatinine, Ser 1.06 0.61 - 1.24 mg/dL  ? Calcium 9.1 8.9 - 10.3 mg/dL  ? Total Protein 7.4 6.5 - 8.1 g/dL  ? Albumin 4.3 3.5 - 5.0 g/dL  ? AST 36 15 - 41 U/L  ? ALT 24 0 - 44 U/L  ? Alkaline Phosphatase 63 38 - 126 U/L  ? Total Bilirubin 1.1 0.3 - 1.2 mg/dL  ? GFR, Estimated >60 >60 mL/min  ? Anion gap 9 5 - 15  ?CBC     Status: None  ? Collection Time: 12/18/21  1:20 AM  ?Result Value Ref Range  ? WBC 6.2 4.0 - 10.5 K/uL  ? RBC 4.73 4.22 - 5.81 MIL/uL   ? Hemoglobin 14.2 13.0 - 17.0 g/dL  ? HCT 41.9 39.0 - 52.0 %  ? MCV 88.6 80.0 - 100.0 fL  ? MCH 30.0 26.0 - 34.0 pg  ? MCHC 33.9 30.0 - 36.0 g/dL  ? RDW 12.5 11.5 - 15.5 %  ? Platelets 338 150 - 400 K/uL  ? nRBC 0.0 0.0 - 0.2 %  ?VITAMIN D 25 Hydroxy (Vit-D Deficiency, Fractures)     Status: Abnormal  ? Collection Time: 12/18/21  1:20 AM  ?Result Value Ref Range  ? Vit D, 25-Hydroxy 29.93 (L) 30 - 100 ng/mL  ?  ?I have Reviewed nursing notes, Vitals, and Lab results since pt's last encounter. Pertinent lab results : see above ?I have ordered test including BMP, CBC, Mg ?I have reviewed the last note from staff over past 24 hours ?I have discussed pt's care plan and test results with nursing staff, case manager ? ? LOS: 1 day  ? ?Lewie Chamber, MD ?[facility] ?12/18/2021, 1:49 PM ? ?

## 2021-12-18 NOTE — Progress Notes (Signed)
Dr. Lorrin Goodell (Neurology) notified of patient's arrival. ?

## 2021-12-18 NOTE — Assessment & Plan Note (Signed)
-   cessation counseling provided  ?

## 2021-12-18 NOTE — Assessment & Plan Note (Signed)
-   Evaluated by neurology on admission due to concern for seizure activity ?- He was suspected to have seizures provoked by Wellbutrin use which has been discontinued ?- He continues on Keppra 500 mg twice daily for 1 week while Wellbutrin is stopped ?- Valtoco 7.5 mg each nostril for total of 15 mg recommended per neurology for generalized tonic-clonic seizure lasting more than 5 minutes.  Explained to mother who understands. ?- Outpatient follow-up with neurology recommended ?- Patient has been instructed no driving for 6 months and seizure-free before resuming ?- longterm AED not recommended unless recurrent seizures after stopping Wellbutrin  ?

## 2021-12-18 NOTE — Progress Notes (Signed)
No charge same-day progress note. ? ?On my entry into the room, patient was actively vomiting green bilious emesis, and reported that this is not uncommon and happens maybe once a month at home.  On ambulation to the restroom he was very ataxic.  Neurological exam was otherwise nonfocal, 5/5 strength throughout, cranial nerves intact, mental status notable for some mild repetition/confusion.  He was able to tell me that he was told to discontinue Wellbutrin to prevent seizures, but did not recall being told he could not drive for 6 months (though he noted he does not drive currently).  Some of his speech was difficult to follow (tangential). ? ?Recommend continuing to observe until patient is back to baseline able to ambulate normally. ?Work-up/assessment of emesis per primary team ?Remainder of recommendations as per Dr. Tollie Eth prior note ? ?Brooke Dare MD-PhD ?[facility] ?[phone]  ?Available 7 AM to 7 PM, outside these hours please contact Neurologist on call listed on AMION  ?

## 2021-12-18 NOTE — Consult Note (Addendum)
NEUROLOGY CONSULTATION NOTE  ? ?Date of service: December 18, 2021 ?Patient Name: [facility] ?MRN:  [id number] ?DOB:  10/18/2000 ?Reason for consult: "Seizure x 2" ?Requesting Provider: Jonnie Finner, DO ?_ _ _   _ __   _ __ _ _  __ __   _ __   __ _ ? ?History of Present Illness  ?Sean Calderon is a 21 y.o. male with PMH significant for psychosis, anxiety who presents with seizures x2.  He has very little to no recollection of the actual seizure.  I am unable to get in touch with family at this time.  Per notes, it seems that patient's brother noticed that he was shaking and drooling, choking and unresponsive.  EMS called and he was brought into the ED.  In the ED, he had a second witnessed seizure.  He was given Ativan, loaded with Keppra and admitted for observation.  He was transferred to [facility] due to inability to obtain a routine EEG due to restlessness and unfortunately EEG could not be obtained at [place] [facility] over the weekend. ? ?He denies any prior history of seizures.  He denies any alcohol intake.  He does not smoke.  He uses marijuana recreationally.  He is unaware that he had a second seizure.  He denies any warning symptoms prior to the seizure onset.  No significant head injury with loss of consciousness. ? ?He reports that he was recently started on Wellbutrin in November 2022 and has been taking about 150 mg daily since then. ?  ?ROS  ? ?Constitutional Denies weight loss, fever and chills.   ?HEENT Denies changes in vision and hearing.   ?Respiratory Denies SOB and cough.   ?CV Denies palpitations and CP   ?GI Denies abdominal pain, nausea, vomiting and diarrhea.   ?GU Denies dysuria and urinary frequency.   ?MSK Denies myalgia and joint pain.   ?Skin Denies rash and pruritus.   ?Neurological Denies headache and syncope.   ?Psychiatric Denies recent changes in mood. Denies anxiety and depression.   ? ?Past History  ? ?Past Medical History:  ?Diagnosis Date  ? ADHD (attention deficit  hyperactivity disorder)   ? Mental disorder   ? Psychosis ([place])   ? ?Past Surgical History:  ?Procedure Laterality Date  ? NO PAST SURGERIES    ? ?Family History  ?Problem Relation Age of Onset  ? Migraines Brother   ? Seizures Neg Hx   ? Depression Neg Hx   ? Anxiety disorder Neg Hx   ? ADD / ADHD Neg Hx   ? Autism Neg Hx   ? ?Social History  ? ?Socioeconomic History  ? Marital status: Single  ?  Spouse name: Not on file  ? Number of children: Not on file  ? Years of education: Not on file  ? Highest education level: Not on file  ?Occupational History  ? Not on file  ?Tobacco Use  ? Smoking status: Never  ? Smokeless tobacco: Never  ?Vaping Use  ? Vaping Use: Never used  ?Substance and Sexual Activity  ? Alcohol use: No  ? Drug use: No  ? Sexual activity: Never  ?Other Topics Concern  ? Not on file  ?Social History Narrative  ? Kaesen is in the 11th grade at [employer]; he does well in school. He lives with his mother and brother. He enjoys reading, playing video games, and watching shows.   ? ?Social Determinants of Health  ? ?Financial Resource Strain:  Not on file  ?Food Insecurity: Not on file  ?Transportation Needs: Not on file  ?Physical Activity: Not on file  ?Stress: Not on file  ?Social Connections: Not on file  ? ?Allergies  ?Allergen Reactions  ? Bupropion Other (See Comments)  ?  seizure  ? ? ?Medications  ? ?Medications Prior to Admission  ?Medication Sig Dispense Refill Last Dose  ? WAL-DRYL ALLERGY 25 MG tablet Take 25 mg by mouth daily as needed for itching or allergies.   12/17/2021  ? b complex vitamins tablet Take 1 tablet by mouth daily. (Patient not taking: Reported on 08/23/2018)   Not Taking  ? buPROPion (WELLBUTRIN XL) 150 MG 24 hr tablet Take 150 mg by mouth every morning.   12/17/21  ? cyproheptadine (PERIACTIN) 4 MG tablet Take 2 tablets (8 mg total) by mouth at bedtime. (Patient not taking: Reported on 12/17/2021) 60 tablet 3 Not Taking  ? ibuprofen (ADVIL,MOTRIN) 100 MG/5ML  suspension Take 18 mLs (360 mg total) by mouth every 6 (six) hours as needed for pain or fever. (Patient not taking: Reported on 06/19/2018) 237 mL 0 Not Taking  ? Magnesium Oxide 500 MG TABS Take 1 tablet (500 mg total) by mouth daily. (Patient not taking: Reported on 12/28/2018)  0 Not Taking  ?  ? ?Vitals  ? ?Vitals:  ? 12/17/21 2300 12/17/21 2330 12/18/21 0046 12/18/21 0305  ?BP: 106/70 100/64 103/64 100/62  ?Pulse: 82 77 87 90  ?Resp:  20 16 19   ?Temp:   (!) 97.3 ?F (36.3 ?C) 97.6 ?F (36.4 ?C)  ?TempSrc:   Axillary Axillary  ?SpO2: 97% 97% 97% 98%  ?Weight:      ?Height:      ?  ? ?Body mass index is 21.46 kg/m?. ? ?Physical Exam  ? ?General: Laying comfortably in bed; in no acute distress.  ?HENT: Normal oropharynx and mucosa. Normal external appearance of ears and nose.  ?Neck: Supple, no pain or tenderness  ?CV: No JVD. No peripheral edema.  ?Pulmonary: Symmetric Chest rise. Normal respiratory effort.  ?Abdomen: Soft to touch, non-tender.  ?Ext: No cyanosis, edema, or deformity  ?Skin: No rash. Normal palpation of skin.   ?Musculoskeletal: Normal digits and nails by inspection. No clubbing.  ? ?Neurologic Examination  ?Mental status/Cognition: Somnolent but he awakens to voice and does not answer questions however requires a lot of encouragement.  He is oriented to self, place, month and year, good attention.  ?Speech/language: Fluent, comprehension intact, object naming intact, repetition intact.  ?Cranial nerves:  ? CN II Pupils equal and reactive to light, no VF deficits   ? CN III,IV,VI EOM intact, no gaze preference or deviation, no nystagmus   ? CN V normal sensation in V1, V2, and V3 segments bilaterally   ? CN VII no asymmetry, no nasolabial fold flattening   ? CN VIII normal hearing to speech   ? CN IX & X normal palatal elevation, no uvular deviation   ? CN XI 5/5 head turn and 5/5 shoulder shrug bilaterally   ? CN XII midline tongue protrusion   ? ?Motor:  ?Muscle bulk: normal, tone normal, pronator  drift none tremor none ?Mvmt Root Nerve  Muscle Right Left Comments  ?SA C5/6 Ax Deltoid     ?EF C5/6 Mc Biceps 5 5   ?EE C6/7/8 Rad Triceps 5 5   ?WF C6/7 Med FCR     ?WE C7/8 PIN ECU     ?F Ab C8/T1 U ADM/FDI 5 5   ?  HF L1/2/3 Fem Illopsoas 5 5   ?KE L2/3/4 Fem Quad     ?DF L4/5 D Peron Tib Ant 5 5   ?PF S1/2 Tibial Grc/Sol 5 5   ? ?Sensation: ? Light touch Intact throughout  ? Pin prick   ? Temperature   ? Vibration   ?Proprioception   ? ?Coordination/Complex Motor:  ?- Finger to Nose intact bilaterally ?- Heel to shin deferred due to somnolence ?- Rapid alternating movement are normal ?- Gait: Deferred for patient's safety. ?Labs  ? ?CBC:  ?Recent Labs  ?Lab 12/17/21 ?1039 12/18/21 ?0120  ?WBC 5.8 6.2  ?HGB 15.2 14.2  ?HCT 46.2 41.9  ?MCV 91.3 88.6  ?PLT 347 338  ? ? ?Basic Metabolic Panel:  ?Lab Results  ?Component Value Date  ? NA 138 12/18/2021  ? K 3.4 (L) 12/18/2021  ? CO2 22 12/18/2021  ? GLUCOSE 63 (L) 12/18/2021  ? BUN 8 12/18/2021  ? CREATININE 1.06 12/18/2021  ? CALCIUM 9.1 12/18/2021  ? GFRNONAA >60 12/18/2021  ? GFRAA NOT CALCULATED 01/24/2013  ? ?Lipid Panel: No results found for: [place] ?HgbA1c: No results found for: HGBA1C ?Urine Drug Screen:  ?   ?Component Value Date/Time  ? LABOPIA NONE DETECTED 12/17/2021 1044  ? COCAINSCRNUR NONE DETECTED 12/17/2021 1044  ? LABBENZ NONE DETECTED 12/17/2021 1044  ? AMPHETMU NONE DETECTED 12/17/2021 1044  ? THCU POSITIVE (A) 12/17/2021 1044  ? LABBARB NONE DETECTED 12/17/2021 1044  ?  ?Alcohol Level  ?   ?Component Value Date/Time  ? ETH <11 01/24/2013 1118  ? ? ?CT Head without contrast(Personally reviewed): ?CTH was negative for a large hypodensity concerning for a large territory infarct or hyperdensity concerning for an ICH ? ?CT thoracic spine [personally reviewed]: ?Age-indeterminate but not definitely acute multiple compression ?fractures. Height loss is greatest at T6 and T7 measuring about 50%. ?There is no significant osseous retropulsion. ? ?MRI  Brain(Personally reviewed): ?No signal abnormality in the brain parenchyma on T2/FLAIR images.  T1 sagittal sequences appear normal.  He was unable to tolerate the entirety of the MRI but I think sufficient images wer

## 2021-12-19 ENCOUNTER — Encounter: Payer: Self-pay | Admitting: Internal Medicine

## 2021-12-19 DIAGNOSIS — S22000A Wedge compression fracture of unspecified thoracic vertebra, initial encounter for closed fracture: Secondary | ICD-10-CM

## 2021-12-19 MED ORDER — VALTOCO 15 MG DOSE 7.5 MG/0.1ML NA LQPK: NASAL | 2 refills | Status: DC

## 2021-12-19 MED ORDER — LEVETIRACETAM 500 MG PO TABS: 500.0000 mg | ORAL_TABLET | Freq: Two times a day (BID) | ORAL | 0 refills | Status: DC

## 2021-12-19 MED ORDER — ACETAMINOPHEN 500 MG PO TABS: 500.0000 mg | ORAL_TABLET | ORAL | 0 refills | Status: DC | PRN

## 2021-12-19 MED ORDER — OYSTER SHELL CALCIUM/D3 500-5 MG-MCG PO TABS: 1.0000 | ORAL_TABLET | Freq: Every day | ORAL | Status: AC

## 2021-12-19 MED ORDER — FLUOXETINE HCL 20 MG PO CAPS: 20.0000 mg | ORAL_CAPSULE | Freq: Every day | ORAL | 2 refills | Status: DC

## 2021-12-19 NOTE — Consult Note (Signed)
[facility] Face-to-Face Psychiatry Consult  ? ?Reason for Consult:  "hx depression/anxiety have discontinued wellbutrin 2/2 seizure; now having very labile mood/emotions" ?Referring Physician:  Dr. Frederick [facility] ?Patient Identification: Sean Calderon ?MRN:  [id number] ?Principal Diagnosis: Seizure (HCC) ?Diagnosis:  Principal Problem: ?  Seizure (HCC) ?Active Problems: ?  Psychosis (HCC) ?  ADHD (attention deficit hyperactivity disorder), combined type ?  Compression fracture of body of thoracic vertebra (HCC) ?  Marijuana abuse ?  Ataxia ? ? ?Total Time spent with patient: 1 hour ? ?HPI  ?Sean Calderon is a 21 y.o. male with past history of anxiety/depression wh is admitted with AMS; was admitted on 4/28 with suspected seizures and wellbutrin was discontinued and he was started on keppra bridge. Psychiatry was consulted for above reason. ? ?Patient seen and chart reviewed. Patient interviewed with mother present this AM with consent from patient. Patient recounts what led to hospitalization and affirms that his wellbutrin was discontinued. He is calm, cooperative and pleasant throughout interview although requests discharge multiple times throughout.  Discussed reason for psychiatry consult with patient and mother verbalized understanding.  ? ?Patient states that he was taking wellbutrin for approximately 6 months prior to it being discotnnued and that he was prescribed this medication by his PCP [facility] (sp?) for depression. Patient describes his mood as "Ready to  go" and inquires about discharge. He reports depressed mood in the past and recently  and indicates that wellbutrin was helpful. Education provided to patient regarding wellbutrin lowering the seizure threshold and reason for discontinuation. Patient and mother verbalized understanding. 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  Patient states that he sees a therapist through school named Sean Fus although does not currently have a therapist or counselor outside of [place]. Patient denies SI/HI. ? ? Patient currently denies AVH although describes instances where he has seen unusual things. Patient pulls up his phone and shows me a list of concerns that he had written down to speak to his therapist about. This list includes paranoia, seeing glowing objects, invisible bugs on his skin, and making a deal with a diety for 2 memories. On further clarification, patient states that the making a deal with a diety for 2 memories was a dream and that the other things listed do not concern him as he knows they are not real. Patient admits to substance use- states that he buys THC supplements off the Internet and affirms that at times it does to exacerbate paranoia. He describes using substances as a means of coping with stress. Mother states that on one occasion he took [facility] gummies and that he had stomach pain and felt sick. Patient affirms mother's report of feeling sick but denies that he took gummies and states it was "edibles". Patient states that his mother was recently on vacation and that things "got worse" during this time.  ?Discussed option(s) for antidepressents and patien  tis agreeable to prozac 20 mg ? ?On my interview, patient is in NAD, alert, oriented, calm, cooperative, and attentive, with normal affect, speech, and behavior. Objectively, there is no evidence of  psychosis/ mania (able to converse coherently, linear and goal directed thought, no RIS, no distractibility, not pre-occupied, no FOI, etc) nor depression to the point of suicidality (able to concentrate, affect full and reactive, speech normal r/v/t, no psychomotor retardation/agitation, etc). ? ? ? ? ? ? ?Past Psychiatric History: ?Previous Medication Trials: wellbutrin and other antidepressants-unable to recall names but believes he was on lexapro in the past ?Previous Psychiatric Hospitalizations: per chart review was [facility] at [facility] 2014 as he reported AVH in the context of bullying at school and was prescribed risperidone 0.5 BID- per chart review patient was never documented as objectively psychotic although was reporting hallucinations ?Previous Suicide Attempts: denies ?History of Violence: no ?Outpatient psychiatrist: no ? 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 Transferred from [employer]; states that the transition was not difficult ?Special Ed: no ?Housing Status: dorm with roommate ?Easy access to gun: no ? ?Substance Use (with emphasis over the last 12 months) ?Recreational Drugs: reports buying THC supplements off of the internet and reports doing edibles in the past. Mother describes patient laughing and smiling when when edibles and states that he took [facility] gummies in the past that made him feel sick (stomach ache) ?Use of Alcohol: denied ?Tobacco Use: no ?Rehab History: n/a ?H/O Complicated Withdrawal: n/a ? ? ?Family Psychiatric History: ?Mother with depression and anxiety- states that she takes hydroxyzine for anxiety ?Denies h/o attempted or completed suicides; no history of psychotic disorders ? ? ?Past Psychiatric History: ADHD, anxiety/depression, h/o psychosis per chart review ? ?Risk to Self:  no ?Risk to Others:  no ?Prior Inpatient Therapy:  no ?Prior Outpatient Therapy:  no ? ?Past Medical History:  ?Past  Medical History:  ?Diagnosis Date  ? ADHD (attention deficit hyperactivity disorder)   ? Mental disorder   ? Psychosis (HCC)   ?  ?Past Surgical History:  ?Procedure Laterality Date  ? NO PAST SURGERIES    ? ?Family History:  ?Family History  ?Problem Relation Age of Onset  ? Migraines Brother   ? Seizures Neg Hx   ? Depression Neg Hx   ? Anxiety disorder Neg Hx   ? ADD / ADHD Neg Hx   ? Autism Neg Hx   ? ? ?Social History:  ?Social History  ? ?Substance and Sexual Activity  ?Alcohol Use No  ?   ?Social History  ? ?Substance and Sexual Activity  ?Drug Use No  ?  ?Social History  ? ?Socioeconomic History  ? Marital status: Single  ?  Spouse name: Not on file  ? Number of children: Not on file  ? Years of education: Not on file  ? Highest education level: Not on file  ?Occupational History  ? Not on file  ?Tobacco Use  ? Smoking status: Never  ? Smokeless tobacco: Never  ?Vaping Use  ? Vaping Use: Never used  ?Substance and Sexual Activity  ? Alcohol use: No  ? Drug use: No  ? Sexual activity: Never  ?Other Topics Concern  ? Not on file  ?Social History Narrative  ? Nicholaos is in the 11th grade at [employer]; he does well in school. He lives with his mother and brother. He enjoys reading, playing video games, and watching shows.   ? ?  Social Determinants of Health  ? ?[facility] Resource Strain: Not on file  ?Food Insecurity: Not on file  ?Transportation Needs: Not on file  ?Physical Activity: Not on file  ?Stress: Not on file  ?Social Connections: Not on file  ? ?Additional Social History: ?  ? ?Allergies:   ?Allergies  ?Allergen Reactions  ? Bupropion Other (See Comments)  ?  seizure  ? ? ?Labs:  ?Results for orders placed or performed during the hospital encounter of 12/17/21 (from the past 48 hour(s))  ?CBG monitoring, ED     Status: Abnormal  ? Collection Time: 12/17/21  2:28 PM  ?Result Value Ref Range  ? Glucose-Capillary 110 (H) 70 - 99 mg/dL  ?  Comment: Glucose reference range applies only to samples  taken after fasting for at least 8 hours.  ?HIV Antibody (routine testing w rflx)     Status: None  ? Collection Time: 12/18/21  1:20 AM  ?Result Value Ref Range  ? HIV Screen 4th Generation wRfx Non Reac

## 2021-12-19 NOTE — Progress Notes (Signed)
Discharged home stable, accompanied by mom. ?

## 2021-12-19 NOTE — Discharge Summary (Signed)
?Physician Discharge Summary ?  ?Patient: Sean Calderon MRN: [id number] DOB: [birth year]  ?Admit date:     12/17/2021  ?Discharge date: 12/19/2021  ?Discharge Physician: Lewie Chamber  ? ?PCP: Tammi Sou, NP  ? ?Recommendations at discharge:  ? ? If recurrent seizure, is recommended to have AED therapy ? ?Discharge Diagnoses: ?Principal Problem: ?  Seizure (HCC) ?Active Problems: ?  Compression fracture of body of thoracic vertebra (HCC) ?  Psychosis (HCC) ?  ADHD (attention deficit hyperactivity disorder), combined type ?  Marijuana abuse ? ?Resolved Problems: ?  Ataxia ? ?Hospital Course: ?Sean Calderon is a 21 yo male with PMH depression/anxiety , ADHD who presented with altered mentation.  He was unable to provide collateral information on admission and history was provided by his mother.  It was reported that he was noted to be shaking and drooling.  He was not responsive to verbal stimuli.  There was no incontinence of bowel or bladder.  He appears to have a presumed seizure back in 2019 and at that time was diagnosed with tension headaches.  He also has been on Wellbutrin prior to admission.  He underwent work-up and neurology evaluation.  He was recommended to be discontinued off of Wellbutrin and bridged with a 1 week course of Keppra. ?With further work-up he was also found to have a compression fracture of T6-T7.  He was evaluated by neurosurgery with no recommendations for surgical intervention.  Vitamin D level was mildly low and he was started on supplementation.  He was recommended to have close outpatient follow-up with primary care for any further work-up needed. ? ?Assessment and Plan: ?* Seizure (HCC) ?- Evaluated by neurology on admission due to concern for seizure activity ?- He was suspected to have seizures provoked by Wellbutrin use which has been discontinued ?- He continues on Keppra 500 mg twice daily for 1 week while Wellbutrin is stopped ?- Valtoco 7.5 mg each nostril for total of 15  mg recommended per neurology for generalized tonic-clonic seizure lasting more than 5 minutes.  Explained to mother who understands. ?- Outpatient follow-up with neurology recommended ?- Patient has been instructed no driving for 6 months and seizure-free before resuming ?- longterm AED not recommended unless recurrent seizures after stopping Wellbutrin  ? ?Compression fracture of body of thoracic vertebra (HCC) ?- possibly from a fall unwitnessed or during seizure? Patient unaware of how he may have fallen ?-No intervention recommended by neurosurgery ?- Pain is minimal ?- Patient recommended to start on calcium and vitamin D supplementation ?-He is recommended to have outpatient work-up for further investigation regarding underlying osteopenia or poor bone density ? ?Ataxia-resolved as of 12/19/2021 ?- Remains very ataxic with unsteady gait.  Possibly due to multiple medications received on admission and recovering from presumed seizure. ?- ataxia is also listed as CNS side effect of keppra; will discuss with neurology; not considered contributory given low dose ?- checking B12 (996), folate (28.3), B1 ?-No cerebellar stroke appreciated on MRI brain ?- resolved prior to discharge and worked well with PT ? ?Marijuana abuse ?- cessation counseling provided  ? ? ? ?  ? ?Consultants: Neurology ?Procedures performed:   ?Disposition: Home ?Diet recommendation:  ?Discharge Diet Orders (From admission, onward)  ? ?  Start     Ordered  ? 12/19/21 0000  Diet general       ? 12/19/21 1234  ? ?  ?  ? ?  ? ?Regular diet ?DISCHARGE MEDICATION: ?Allergies as of 12/19/2021   ? ?  Reactions  ? Bupropion Other (See Comments)  ? seizure  ? ?  ? ?  ?Medication List  ?  ? ?STOP taking these medications   ? ?b complex vitamins tablet ?  ?buPROPion 150 MG 24 hr tablet ?Commonly known as: WELLBUTRIN XL ?  ?cyproheptadine 4 MG tablet ?Commonly known as: PERIACTIN ?  ?ibuprofen 100 MG/5ML suspension ?Commonly known as: ADVIL ?  ?Magnesium  Oxide 500 MG Tabs ?  ?[employer] Allergy 25 MG tablet ?Generic drug: diphenhydrAMINE ?  ? ?  ? ?TAKE these medications   ? ?acetaminophen 500 MG tablet ?Commonly known as: TYLENOL ?Take 1 tablet (500 mg total) by mouth every 4 (four) hours as needed for mild pain or moderate pain. ?  ?calcium-vitamin D 500-5 MG-MCG tablet ?Commonly known as: OSCAL WITH D ?Take 1 tablet by mouth daily with breakfast. ?Start taking on: Dec 20, 2021 ?  ?FLUoxetine 20 MG capsule ?Commonly known as: PROZAC ?Take 1 capsule (20 mg total) by mouth daily. ?  ?levETIRAcetam 500 MG tablet ?Commonly known as: KEPPRA ?Take 1 tablet (500 mg total) by mouth 2 (two) times daily for 7 days. ?  ?Valtoco 15 MG Dose 2 x 7.5 MG/0.1ML Lqpk ?Generic drug: diazePAM (15 MG Dose) ?Place 7.5 mg in each nostril for a seizure if lasting more than 5 minutes ?  ? ?  ? ? ?Discharge Exam: ?Filed Weights  ? 12/17/21 1033  ?Weight: 56.7 kg  ? ?Physical Exam ?Constitutional:   ?   General: He is not in acute distress. ?   Appearance: Normal appearance.  ?   Comments: Thin  ?HENT:  ?   Head: Normocephalic and atraumatic.  ?   Mouth/Throat:  ?   Mouth: Mucous membranes are moist.  ?Eyes:  ?   Extraocular Movements: Extraocular movements intact.  ?Cardiovascular:  ?   Rate and Rhythm: Normal rate and regular rhythm.  ?   Heart sounds: Normal heart sounds.  ?Pulmonary:  ?   Effort: Pulmonary effort is normal. No respiratory distress.  ?   Breath sounds: Normal breath sounds. No wheezing.  ?Abdominal:  ?   General: Bowel sounds are normal. There is no distension.  ?   Palpations: Abdomen is soft.  ?   Tenderness: There is no abdominal tenderness.  ?Musculoskeletal:     ?   General: Normal range of motion.  ?   Cervical back: Normal range of motion and neck supple.  ?Skin: ?   General: Skin is warm and dry.  ?Neurological:  ?   Mental Status: He is alert and oriented to person, place, and time.  ?   Gait: Gait normal.  ?Psychiatric:     ?   Mood and Affect: Mood normal.     ?    Behavior: Behavior normal.  ? ? ? ?Condition at discharge: stable ? ?The results of significant diagnostics from this hospitalization (including imaging, microbiology, ancillary and laboratory) are listed below for reference.  ? ?Imaging Studies: ?DG Thoracic Spine W/Swimmers ? ?Result Date: 12/17/2021 ?CLINICAL DATA:  Fall, upper abdominal pain radiating to the back EXAM: THORACIC SPINE - 3 VIEWS COMPARISON:  None. FINDINGS: Mild vertebral body height loss of 2 midthoracic vertebral segments, approximately T6 and T7. Normal alignment. Disc heights are preserved. IMPRESSION: Mild vertebral body height loss of two adjacent midthoracic vertebral segments, approximately T6 and T7. Correlate with point tenderness. Electronically Signed   By: Duanne GuessNicholas  Plundo D.O.   On: 12/17/2021 13:56  ? ?CT Head Wo  Contrast ? ?Result Date: 12/17/2021 ?CLINICAL DATA:  Seizures EXAM: CT HEAD WITHOUT CONTRAST TECHNIQUE: Contiguous axial images were obtained from the base of the skull through the vertex without intravenous contrast. RADIATION DOSE REDUCTION: This exam was performed according to the departmental dose-optimization program which includes automated exposure control, adjustment of the mA and/or kV according to patient size and/or use of iterative reconstruction technique. COMPARISON:  Images of previous study done on 05/23/2018 are not available for comparison at the time of this report. Report for the previous study was reviewed. FINDINGS: Brain: No acute intracranial findings are seen. Ventricles are not dilated. There is no shift of midline structures. There are no epidural or subdural fluid collections. Vascular: Unremarkable. Skull: Unremarkable. Sinuses/Orbits: There is mild mucosal thickening in the ethmoid sinus. Other: None IMPRESSION: No acute intracranial findings are seen in noncontrast CT brain. Electronically Signed   By: Ernie Avena M.D.   On: 12/17/2021 15:04  ? ?CT Thoracic Spine Wo Contrast ? ?Result  Date: 12/17/2021 ?CLINICAL DATA:  Back trauma, no prior imaging (Age >= 16y) EXAM: CT THORACIC SPINE WITHOUT CONTRAST TECHNIQUE: Multidetector CT images of the thoracic were obtained using the standard

## 2021-12-19 NOTE — Progress Notes (Signed)
This RN encountered patient at the nurse's station along with his mother. Patient extremely agitated and crying. Mother on the phone with patient's PCP. Patient states he does not understand why he has to continue to stay in the hospital. This RN escorted patient back to the room in an attempt to call him down. Upon entering the room, patient's bother was in the room and began to engage patient in taunting and arguing. Patient became even more emotional and agitated. This RN had patient's brother leave. Patient's mother states she wants patient to stay and continue observation. Patient states he will stay if he is able to get something to help him calm down or sleep. MD notified. ?

## 2021-12-19 NOTE — Progress Notes (Signed)
Discharge instructions given to mother and pt, verbalized understanding. ?

## 2021-12-19 NOTE — Progress Notes (Signed)
0400 vitals deferred. Patient's mother asked that RN not wake patient for vitals because of agitation earlier in the shift and patient complaining of not getting sleep. Patient resting comfortably and appears stable.  ?

## 2021-12-19 NOTE — Evaluation (Signed)
Physical Therapy Evaluation and Discharge ?Patient Details ?Name: Sean Calderon ?MRN: [id number] ?DOB: Nov 06, 2000 ?Today's Date: 12/19/2021 ? ?History of Present Illness ? 21 y.o. male Presenting 12/17/21 with altered mental status. ? two seizures; EEG could not cooperate on not obtained; CT head negative, MRI brain negaitve; CT thoracic spine acute multiple compression fractures (worst at T6-T7)   PMH significant of psychosis, anxiety, ADHD.  ?Clinical Impression ?  ?Patient evaluated by Physical Therapy with no further acute PT needs identified. All education has been completed and the patient has no further questions. Patient ambulating initially with supervision and progressed to independent with incr distance and slightly slower pace.  PT is signing off. Thank you for this referral. ?   ?   ? ?Recommendations for follow up therapy are one component of a multi-disciplinary discharge planning process, led by the attending physician.  Recommendations may be updated based on patient status, additional functional criteria and insurance authorization. ? ?Follow Up Recommendations No PT follow up ? ?  ?Assistance Recommended at Discharge Set up Supervision/Assistance  ?Patient can return home with the following ? A little help with walking and/or transfers;A little help with bathing/dressing/bathroom ? ?  ?Equipment Recommendations None recommended by PT  ?Recommendations for Other Services ?    ?  ?Functional Status Assessment Patient has had a recent decline in their functional status and demonstrates the ability to make significant improvements in function in a reasonable and predictable amount of time.  ? ?  ?Precautions / Restrictions Precautions ?Precautions: None ?Restrictions ?Weight Bearing Restrictions: No  ? ?  ? ?Mobility ? Bed Mobility ?Overal bed mobility: Independent ?  ?  ?  ?  ?  ?  ?  ?  ? ?Transfers ?Overall transfer level: Independent ?Equipment used: None ?  ?  ?  ?  ?  ?  ?  ?General transfer  comment: Pt "hopped" out of bed with RLE tangled in blanket with imbalance but independent recovery ?  ? ?Ambulation/Gait ?Ambulation/Gait assistance: Supervision ?Gait Distance (Feet): 350 Feet ?Assistive device: None ?Gait Pattern/deviations: Drifts right/left (with head turns initially; progressed to head turns without drift) ?Gait velocity: a bit too fast initially for his balance; once slowed down he did not lose his balance or drift ?  ?  ?  ? ?Stairs ?  ?  ?  ?  ?  ? ?Wheelchair Mobility ?  ? ?Modified Rankin (Stroke Patients Only) ?  ? ?  ? ?Balance Overall balance assessment: Independent ?  ?  ?  ?  ?  ?  ?  ?  ?  ?  ?  ?  ?  ?  ?  ?Standardized Balance Assessment ?Standardized Balance Assessment : Dynamic Gait Index ?  ?Dynamic Gait Index ?Level Surface: Normal ?Change in Gait Speed: Normal ?Gait with Horizontal Head Turns: Mild Impairment ?Gait with Vertical Head Turns: Normal ?Gait and Pivot Turn: Normal ?Step Over Obstacle: Normal ?Step Around Obstacles: Normal ?Steps: Normal ?Total Score: 23 ?   ? ? ? ?Pertinent Vitals/Pain Pain Assessment ?Pain Assessment: No/denies pain  ? ? ?Home Living Family/patient expects to be discharged to:: Private residence ?Living Arrangements: Parent;Other relatives (mom and 3 brothers) ?Available Help at Discharge: Family ?  ?  ?  ?  ?  ?  ?  ?   ?  ?Prior Function Prior Level of Function : Independent/Modified Independent ?  ?  ?  ?  ?  ?  ?  ?  ?  ? ? ?Hand  Dominance  ?   ? ?  ?Extremity/Trunk Assessment  ? Upper Extremity Assessment ?Upper Extremity Assessment: Overall WFL for tasks assessed ?  ? ?Lower Extremity Assessment ?Lower Extremity Assessment: Overall WFL for tasks assessed ?  ? ?Cervical / Trunk Assessment ?Cervical / Trunk Assessment: Normal  ?Communication  ? Communication: No difficulties  ?Cognition Arousal/Alertness: Awake/alert ?Behavior During Therapy: [facility] for tasks assessed/performed ?Overall Cognitive Status: Within Functional Limits for tasks  assessed ?  ?  ?  ?  ?  ?  ?  ?  ?  ?  ?  ?  ?  ?  ?  ?  ?  ?  ?  ? ?  ?General Comments General comments (skin integrity, edema, etc.): Mother concerned that pt needs to still use RW. Explained that at speed he chooses to walk, it will be more a liability than a help. When pt slows down his pace, he does not experience any drift or imbalance. ? ?  ?Exercises    ? ?Assessment/Plan  ?  ?PT Assessment Patient does not need any further PT services  ?PT Problem List   ? ?   ?  ?PT Treatment Interventions     ? ?PT Goals (Current goals can be found in the Care Plan section)  ?Acute Rehab PT Goals ?Patient Stated Goal: go home today ?PT Goal Formulation: All assessment and education complete, DC therapy ? ?  ?Frequency   ?  ? ? ?Co-evaluation   ?  ?  ?  ?  ? ? ?  ?AM-PAC PT "6 Clicks" Mobility  ?Outcome Measure Help needed turning from your back to your side while in a flat bed without using bedrails?: None ?Help needed moving from lying on your back to sitting on the side of a flat bed without using bedrails?: None ?Help needed moving to and from a bed to a chair (including a wheelchair)?: None ?Help needed standing up from a chair using your arms (e.g., wheelchair or bedside chair)?: None ?Help needed to walk in hospital room?: A Little ?Help needed climbing 3-5 steps with a railing? : None ?6 Click Score: 23 ? ?  ?End of Session   ?Activity Tolerance: Patient tolerated treatment well ?Patient left: in bed;with call bell/phone within reach;with family/visitor present ?Nurse Communication: Mobility status ?PT Visit Diagnosis: Unsteadiness on feet (R26.81) ?  ? ?Time: 3300-7622 ?PT Time Calculation (min) (ACUTE ONLY): 10 min ? ? ?Charges:   PT Evaluation ?$PT Eval Low Complexity: 1 Low ?  ?  ?   ? ? ? ?Jerolyn Center, PT ?Acute Rehabilitation Services  ?Pager [phone] ?Office [phone] ? ? ?Sean Calderon ?12/19/2021, 8:50 AM ? ?

## 2021-12-19 NOTE — Progress Notes (Signed)
CSW spoke with pt and his mother regarding outpt psych resources. Pt has a mental health counselor that he sees at school, he can continue over the summer but there may be a charge.  (It was free during the school year)  CSW provided list of outpt providers, emphasized the [facility] as an option for [employer].  Pt and mother verbalized understanding.   ?Daleen Squibb, MSW, LCSW ?4/30/202312:13 PM  ?

## 2021-12-19 NOTE — Progress Notes (Signed)
Neurology Progress Note ? ?Patient ID: Sean Calderon is a 21 y.o. with PMHx of  has a past medical history of ADHD (attention deficit hyperactivity disorder), Mental disorder, and Psychosis (HCC). ? ? ?Major interval events/Subjective: ?-Reports multiple episodes of emesis yesterday, does not recall meeting me yesterday ?-Reports he feels his gait is back to baseline ?-Remains eager to leave the hospital ? ?Exam: ?Vitals:  ? 12/19/21 0808 12/19/21 1120  ?BP: (!) 87/48 (!) 94/59  ?Pulse: (!) 52 72  ?Resp: 16 16  ?Temp: 97.7 ?F (36.5 ?C) 98.5 ?F (36.9 ?C)  ?SpO2: 98% 96%  ? ?Gen: In bed, comfortable  ?Resp: non-labored breathing, no grossly audible wheezing ?Cardiac: Perfusing extremities well  ?Abd: soft, nt ? ?Neuro: ?MS: Awake, alert, oriented to date, situation, place ?CN: EOMI to tracking examiner in all directions, orients to visual stimuli in all fields, face symmetric, tongue midline ?Motor: No pronator drift.  Ambulates steadily on casual gait, slightly ataxic on tandem gait, able to rise on toes but not heels due to odd posture when asked to stand on heels ? ?Pertinent Labs: ? ?Basic Metabolic Panel: ?Recent Labs  ?Lab 12/17/21 ?1039 12/18/21 ?0120  ?NA 136 138  ?K 3.8 3.4*  ?CL 104 107  ?CO2 20* 22  ?GLUCOSE 85 63*  ?BUN 12 8  ?CREATININE 0.95 1.06  ?CALCIUM 9.3 9.1  ? ? ?CBC: ?Recent Labs  ?Lab 12/17/21 ?1039 12/18/21 ?0120  ?WBC 5.8 6.2  ?HGB 15.2 14.2  ?HCT 46.2 41.9  ?MCV 91.3 88.6  ?PLT 347 338  ? ? ?Coagulation Studies: ?No results for input(s): LABPROT, INR in the last 72 hours.  ? ?Normal sleep and wake EEG ? ?Impression: This is a 21 year old with seizure in the setting of provoking factor (Wellbutrin).  Extensively reviewed seizures with patient and mother at bedside, seizure precautions, need for short-term Keppra while Wellbutrin gets out of his system, etc. as documented below ? ?Recommendations: ?- Do not resume Wellbutrin. ?- Recommend Keppra 500mg  BID for a week  ?- Will need longterm AEDs  if he has further seizures after discontinuing Wellbutrin, reviewed with mother and patient. ?- No driving for 6 months. Has to be seizure free before he can resume driving. ?- Full seizure precautions listed below and also copied to discharge instructions, reviewed with family and patient. ?- Follow up with neurology outpatient, ambulatory referral to [facility] placed ?- Recommend discharge with Valtoco 7.5mg  in each nostril for a total of 15mg  of Valtoco for generalized tonic clonic seizure outpatient lasting more than 5 mins. ?- Work-up of emesis and management per primary team, consider contribution of marijuana use ?  ?Seizure precautions: ?Per [facility] statutes, patients with seizures are not allowed to drive until they have been seizure-free for six months and cleared by a physician  ?  ?Use caution when using heavy equipment or power tools. Avoid working on ladders or at heights. Take showers instead of baths. Ensure the water temperature is not too high on the home water heater. Do not go swimming alone. Do not lock yourself in a room alone (i.e. bathroom). When caring for infants or small children, sit down when holding, feeding, or changing them to minimize risk of injury to the child in the event you have a seizure. Maintain good sleep hygiene. Avoid alcohol.  ?  ?If patient has another seizure, call 911 and bring them back to the ED if: ?A.  The seizure lasts longer than 5 minutes.      ?  B.  The patient doesn't wake shortly after the seizure or has new problems such as difficulty seeing, speaking or moving following the seizure ?C.  The patient was injured during the seizure ?D.  The patient has a temperature over 102 F (39C) ?E.  The patient vomited during the seizure and now is having trouble breathing ?   ?During the Seizure ?  ?- First, ensure adequate ventilation and place patients on the floor on their left side  ?Loosen clothing around the neck and ensure the airway  is patent. If the patient is clenching the teeth, do not force the mouth open with any object as this can cause severe damage ?- Remove all items from the surrounding that can be hazardous. The patient may be oblivious to what's happening and may not even know what he or she is doing. ?If the patient is confused and wandering, either gently guide him/her away and block access to outside areas ?- Reassure the individual and be comforting ?- Call 911. In most cases, the seizure ends before EMS arrives. However, there are cases when seizures may last over 3 to 5 minutes. Or the individual may have developed breathing difficulties or severe injuries. If a pregnant patient or a person with diabetes develops a seizure, it is prudent to call an ambulance. ?- Finally, if the patient does not regain full consciousness, then call EMS. Most patients will remain confused for about 45 to 90 minutes after a seizure, so you must use judgment in calling for help. ?- Avoid restraints but make sure the patient is in a bed with padded side rails ?- Place the individual in a lateral position with the neck slightly flexed; this will help the saliva drain from the mouth and prevent the tongue from falling backward ?- Remove all nearby furniture and other hazards from the area ?- Provide verbal assurance as the individual is regaining consciousness ?- Provide the patient with privacy if possible ?- Call for help and start treatment as ordered by the caregiver ?  ? After the Seizure (Postictal Stage) ?  ?After a seizure, most patients experience confusion, fatigue, muscle pain and/or a headache. Thus, one should permit the individual to sleep. For the next few days, reassurance is essential. Being calm and helping reorient the person is also of importance. ?  ?Most seizures are painless and end spontaneously. Seizures are not harmful to others but can lead to complications such as stress on the lungs, brain and the heart. Individuals with  prior lung problems may develop labored breathing and respiratory distress.  ? ?Brooke Dare MD-PhD ?[facility] ?[phone]  ? ?Greater than 35 minutes were spent in care of this patient today, greater than 50% of bedside in extensive review of data and plan ? ?

## 2021-12-22 LAB — METHYLMALONIC ACID, SERUM: Methylmalonic Acid, Quantitative: 82 nmol/L (ref 0–378)

## 2021-12-22 LAB — VITAMIN B1: Vitamin B1 (Thiamine): 111.5 nmol/L (ref 66.5–200.0)

## 2021-12-23 ENCOUNTER — Encounter: Payer: Self-pay | Admitting: Neurology

## 2021-12-23 ENCOUNTER — Ambulatory Visit: Payer: Medicaid Other | Admitting: Neurology

## 2021-12-23 VITALS — BP 118/77 | HR 86 | Ht 61.0 in | Wt 121.5 lb

## 2021-12-23 DIAGNOSIS — F419 Anxiety disorder, unspecified: Secondary | ICD-10-CM | POA: Diagnosis not present

## 2021-12-23 DIAGNOSIS — G40909 Epilepsy, unspecified, not intractable, without status epilepticus: Secondary | ICD-10-CM

## 2021-12-23 DIAGNOSIS — G4700 Insomnia, unspecified: Secondary | ICD-10-CM

## 2021-12-23 NOTE — Patient Instructions (Addendum)
Continue with Keppra for a total of 7 days  ?Continue with Prozac, follow up with your psychiatrist/Therapist ?Follow up with your doctor  ?Return as needed  ?

## 2021-12-23 NOTE — Progress Notes (Signed)
? ?[facility] ? ?PATIENT: Sean Calderon ?DOB: 2001/03/01 ? ?REQUESTING CLINICIAN: Bhagat, Karmen Bongo, MD ?HISTORY FROM: Patient and mother  ?REASON FOR VISIT: New onset seizure  ? ? ?HISTORICAL ? ?CHIEF COMPLAINT:  ?Chief Complaint  ?Patient presents with  ? New Patient (Initial Visit)  ?  Room 12 w/ mother, Valaria Good. Hospital follow up for seizures/ataxia. Reports using marijuana about once weekly, prior to his hospital visit. Says he has not smoked it since discharge.   ? ? ?HISTORY OF PRESENT ILLNESS:  ?Vitamin D is a 21 year old male with past medical history of anxiety, insomnia who is presenting after his first lifetime seizure.  Patient presented to the ED via EMS on April 28 after family noticed that he had a seizure-like event at home.  Mother reported that patient was not sleeping for the past week.  In the ED he was also witnessed to have a second seizure.  He was given Ativan and loaded with Keppra.  He denies any seizures risk factor, denies any family history of seizures other than the fact that he is struggled with anxiety is on Wellbutrin, has insomnia and was taking at that time lots of Benadryl and smoke marijuana recreationally.  Denies any major injury from the seizure but currently reported body soreness.  ?In the hospital he did have MRI brain which was negative for any acute intracranial abnormality and a EEG that showed his diffuse slowing.  Patient was started on Keppra and plan was to continue Keppra for total of 7 days.  His Wellbutrin was discontinued and was started on Prozac.  He he is pending appointment with a psychiatrist.  During the hospital also he was found to have a loss in his vertebral disc, and is pending a appointment with a orthopedic surgeon. ? ?Handedness: Right handed  ? ?Onset: April 28 ? ?Seizure Type: Generalized convulsion ? ?Current frequency: First lifetime seizure ? ?Any injuries from seizures: Denies ? ?Seizure risk factors: Denies ? ?Previous  ASMs: None ? ?Currenty ASMs: Levetiracetam 500 mg twice daily for total of 7 days ? ?ASMs side effects: Denies ? ?Brain Images: No acute intracranial abnormality ? ?Previous EEGs: Diffuse slowing this was after patient was given Ativan. ? ? ?OTHER MEDICAL CONDITIONS: Anxiety, insomnia ? ?REVIEW OF SYSTEMS: Full 14 system review of systems performed and negative with exception of: As noted in the HPI ? ?ALLERGIES: ?Allergies  ?Allergen Reactions  ? Bupropion Other (See Comments)  ?  seizure  ? ? ?HOME MEDICATIONS: ?Outpatient Medications Prior to Visit  ?Medication Sig Dispense Refill  ? acetaminophen (TYLENOL) 500 MG tablet Take 1 tablet (500 mg total) by mouth every 4 (four) hours as needed for mild pain or moderate pain. 30 tablet 0  ? calcium-vitamin D (OSCAL WITH D) 500-5 MG-MCG tablet Take 1 tablet by mouth daily with breakfast.    ? diazePAM, 15 MG Dose, (VALTOCO 15 MG DOSE) 2 x 7.5 MG/0.1ML LQPK Place 7.5 mg in each nostril for a seizure if lasting more than 5 minutes 2 each 2  ? FLUoxetine (PROZAC) 20 MG capsule Take 1 capsule (20 mg total) by mouth daily. 30 capsule 2  ? levETIRAcetam (KEPPRA) 500 MG tablet Take 1 tablet (500 mg total) by mouth 2 (two) times daily for 7 days. 14 tablet 0  ? ?No facility-administered medications prior to visit.  ? ? ?PAST MEDICAL HISTORY: ?Past Medical History:  ?Diagnosis Date  ? ADHD (attention deficit hyperactivity disorder)   ? Mental disorder   ?  Psychosis (HCC)   ? ? ?PAST SURGICAL HISTORY: ?Past Surgical History:  ?Procedure Laterality Date  ? WISDOM TOOTH EXTRACTION    ? ? ?FAMILY HISTORY: ?Family History  ?Problem Relation Age of Onset  ? Migraines Brother   ? Seizures Neg Hx   ? Depression Neg Hx   ? Anxiety disorder Neg Hx   ? ADD / ADHD Neg Hx   ? Autism Neg Hx   ? ? ?SOCIAL HISTORY: ?Social History  ? ?Socioeconomic History  ? Marital status: Single  ?  Spouse name: Not on file  ? Number of children: 0  ? Years of education: college student  ? Highest education  level: Not on file  ?Occupational History  ? Not on file  ?Tobacco Use  ? Smoking status: Never  ? Smokeless tobacco: Never  ?Vaping Use  ? Vaping Use: Never used  ?Substance and Sexual Activity  ? Alcohol use: No  ? Drug use: Yes  ?  Types: Marijuana  ?  Comment: Previously, using once weekly. None since hospital on 12/17/21.  ? Sexual activity: Never  ?Other Topics Concern  ? Not on file  ?Social History Narrative  ? He lives with his mother and brother during breaks from school. Lives in dorm during semesters.  ? Right-handed.  ? 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  ? ?Social Determinants of Health  ? ?Financial Resource Strain: Not on file  ?Food Insecurity: Not on file  ?Transportation Needs: Not on file  ?Physical Activity: Not on file  ?Stress: Not on file  ?Social Connections: Not on file  ?Intimate Partner Violence: Not on file  ? ? ?PHYSICAL EXAM ? ?GENERAL EXAM/CONSTITUTIONAL: ?Vitals:  ?Vitals:  ? 12/23/21 1441  ?BP: 118/77  ?Pulse: 86  ?Weight: 121 lb 8 oz (55.1 kg)  ?Height: 5\' 1"  (1.549 m)  ? ?Body mass index is 22.96 kg/m?. ?Wt Readings from Last 3 Encounters:  ?12/23/21 121 lb 8 oz (55.1 kg) (4 %, Z= -1.72)*  ?12/17/21 125 lb (56.7 kg) (7 %, Z= -1.50)*  ?08/23/18 128 lb 4.9 oz (58.2 kg) (31 %, Z= -0.50)*  ? ?* Growth percentiles are based on CDC (Boys, 2-20 Years) data.  ? ?Patient is in no distress; well developed, nourished and groomed; neck is supple ? ?EYES: ?Pupils round and reactive to light, Visual fields full to confrontation, Extraocular movements intacts,  ?No results found. ? ?MUSCULOSKELETAL: ?Gait, strength, tone, movements noted in Neurologic exam below ? ?NEUROLOGIC: ?MENTAL STATUS:  ?   ? View : No data to display.  ?  ?  ?  ? ?awake, alert, oriented to person, place and time ?recent and remote memory intact ?normal attention and concentration ?language fluent, comprehension intact, naming intact ?fund of knowledge appropriate ? ?CRANIAL NERVE:  ?2nd, 3rd, 4th, 6th - pupils equal and reactive to  light, visual fields full to confrontation, extraocular muscles intact, no nystagmus ?5th - facial sensation symmetric ?7th - facial strength symmetric ?8th - hearing intact ?9th - palate elevates symmetrically, uvula midline ?11th - shoulder shrug symmetric ?12th - tongue protrusion midline ? ?MOTOR:  ?normal bulk and tone, full strength in the BUE, BLE ? ?SENSORY:  ?normal and symmetric to light touch, pinprick, temperature, vibration ? ?COORDINATION:  ?finger-nose-finger, fine finger movements normal ? ?REFLEXES:  ?deep tendon reflexes present and symmetric ? ?GAIT/STATION:  ?normal ? ?DIAGNOSTIC DATA (LABS, IMAGING, TESTING) ?- I reviewed patient records, labs, notes, testing and imaging myself where available. ? ?Lab Results  ?Component Value Date  ?  WBC 6.2 12/18/2021  ? HGB 14.2 12/18/2021  ? HCT 41.9 12/18/2021  ? MCV 88.6 12/18/2021  ? PLT 338 12/18/2021  ? ?   ?Component Value Date/Time  ? NA 138 12/18/2021 0120  ? K 3.4 (L) 12/18/2021 0120  ? CL 107 12/18/2021 0120  ? CO2 22 12/18/2021 0120  ? GLUCOSE 63 (L) 12/18/2021 0120  ? BUN 8 12/18/2021 0120  ? CREATININE 1.06 12/18/2021 0120  ? CALCIUM 9.1 12/18/2021 0120  ? PROT 7.4 12/18/2021 0120  ? ALBUMIN 4.3 12/18/2021 0120  ? AST 36 12/18/2021 0120  ? ALT 24 12/18/2021 0120  ? ALKPHOS 63 12/18/2021 0120  ? BILITOT 1.1 12/18/2021 0120  ? GFRNONAA >60 12/18/2021 0120  ? GFRAA NOT CALCULATED 01/24/2013 1115  ? ?No results found for: CHOL, HDL, LDLCALC, LDLDIRECT, TRIG ?No results found for: HGBA1C ?Lab Results  ?Component Value Date  ? NWGNFAOZ30 996 (H) 12/18/2021  ? ?No results found for: TSH ? ?MRI Brain 12/17/21 ?Obtained sequences demonstrate no acute infarction, mass, or edema. Patient could not tolerate full study ? ?Routine EEG 12/18/21 ?This EEG was obtained while awake and asleep and is abnormal due to mild diffuse slowing indicative of global cerebral dysfunction. Epileptiform abnormalities were not seen during this recording ? ? ?I personally  reviewed brain Images and previous EEG reports.  ? ?ASSESSMENT AND PLAN ? ?21 y.o. year old male  with anxiety and insomnia who is presenting after his first lifetime seizure.  Per mother seizure happened in

## 2022-01-06 ENCOUNTER — Ambulatory Visit: Payer: Medicaid Other

## 2022-01-06 ENCOUNTER — Ambulatory Visit: Payer: Medicaid Other | Admitting: Physical Therapy

## 2022-01-11 ENCOUNTER — Ambulatory Visit: Payer: Medicaid Other | Admitting: Neurology

## 2022-01-14 DIAGNOSIS — F331 Major depressive disorder, recurrent, moderate: Secondary | ICD-10-CM | POA: Insufficient documentation

## 2022-01-18 NOTE — Therapy (Unsigned)
OUTPATIENT PHYSICAL THERAPY NEURO EVALUATION   Patient Name: Sean Calderon MRN: [id number] DOB:09-19-2000, 21 y.o., male Today's Date: 01/20/2022   PCP: Tammi Sou, NP REFERRING PROVIDER: Donalee Citrin, MD   PT End of Session - 01/20/22 1735     Visit Number 1    Number of Visits 6    Date for PT Re-Evaluation 03/03/22    Authorization Type [place] [facility]CD    Progress Note Due on Visit 6    PT Start Time 1615    PT Stop Time 1700    PT Time Calculation (min) 45 min    Activity Tolerance Patient tolerated treatment well    Behavior During Therapy WFL for tasks assessed/performed             Past Medical History:  Diagnosis Date   ADHD (attention deficit hyperactivity disorder)    Mental disorder    Psychosis (HCC)    Past Surgical History:  Procedure Laterality Date   WISDOM TOOTH EXTRACTION     Patient Active Problem List   Diagnosis Date Noted   Seizure (HCC) 12/17/2021   Compression fracture of body of thoracic vertebra (HCC) 12/17/2021   Marijuana abuse 12/17/2021   Frequent headaches 06/19/2018   Anxiety state 06/19/2018   Depressed mood 06/19/2018   Vasovagal syncope 06/19/2018   Tension headache 06/19/2018   ADHD (attention deficit hyperactivity disorder), 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   Psychosis (HCC) 12/21/2012    ONSET DATE: 12/31/2021   REFERRING DIAG: M85.89 (ICD-10-CM) - Other specified disorders of bone density and structure, multiple sites   THERAPY DIAG: M85.89 (ICD-10-CM) - Other specified disorders of bone density and structure, multiple sites    Rationale for Evaluation and Treatment Rehabilitation  SUBJECTIVE:                                                                                                                                                                                              SUBJECTIVE STATEMENT: Thoracic pain ongoing since seizure leading to recent hospitalization, denies radiating symptoms or paresthesias, no  previous PT Pt accompanied by: family member  PERTINENT HISTORY: 21 y.o. year old male  with anxiety and insomnia who is presenting after his first lifetime seizure.  Per mother seizure happened in the setting of being on Wellbutrin, weeks of poor sleep and using lots of Benadryl.  Currently the Wellbutrin has been discontinued, patient is switched to Prozac, he was advised not to take Benadryl and to use melatonin as needed for the insomnia.  He will follow-up with his psychiatrist soon.   In terms of his seizure, since this is the first lifetime  seizure and EEG and MRI was unrevealing he does not need long-term antiseizure medication.  In the hospital he was started on Keppra for 7 days just to help him while he is coming off Wellbutrin.  Advised the patient to finish the 7-day course that he does not need long-term antiseizure medication after that.  He need to continue follow-up with his primary care doctor, his psychiatrist/therapist and his orthopedic surgeon.  He will return as needed.  All other questions answered.  They are comfortable with plan.  PAIN:  Are you having pain? Yes: NPRS scale: 7/10 Pain location: thoracic spine Pain description: ache Aggravating factors: lifting prolonged positions Relieving factors: pain patches   PRECAUTIONS: Fall and Other: seizure  WEIGHT BEARING RESTRICTIONS No  FALLS: Has patient fallen in last 6 months? No  LIVING ENVIRONMENT: Lives with: lives with their family Lives in: House/apartment Stairs:  yes Has following equipment at home: None  PLOF: Independent  PATIENT GOALS To reduce and manage my pain  OBJECTIVE:   DIAGNOSTIC FINDINGS: CLINICAL DATA:  Fall, upper abdominal pain radiating to the back   EXAM: THORACIC SPINE - 3 VIEWS   COMPARISON:  None.   FINDINGS: Mild vertebral body height loss of 2 midthoracic vertebral segments, approximately T6 and T7. Normal alignment. Disc heights are preserved.   IMPRESSION: Mild  vertebral body height loss of two adjacent midthoracic vertebral segments, approximately T6 and T7. Correlate with point tenderness.     Electronically Signed   By: Duanne GuessNicholas  Plundo D.O.   On: 12/17/2021 13:56  COGNITION: Overall cognitive status: Within functional limits for tasks assessed   SENSATION: WFL  COORDINATION: WFL   MUSCLE LENGTH: Not tested   POSTURE: rounded shoulders, forward head, and increased thoracic kyphosis  LOWER EXTREMITY ROM:   WFL throughout  Active  Right Eval Left Eval  Hip flexion    Hip extension    Hip abduction    Hip adduction    Hip internal rotation    Hip external rotation    Knee flexion    Knee extension    Ankle dorsiflexion    Ankle plantarflexion    Ankle inversion    Ankle eversion     (Blank rows = not tested)  LOWER EXTREMITY MMT:  WFL throughout  MMT Right Eval Left Eval  Hip flexion    Hip extension    Hip abduction    Hip adduction    Hip internal rotation    Hip external rotation    Knee flexion    Knee extension    Ankle dorsiflexion    Ankle plantarflexion    Ankle inversion    Ankle eversion    (Blank rows = not tested)   STAIRS:  WFL  GAIT:   WFL   FUNCTIONAL TESTs:  5 times sit to stand: 10s  PATIENT SURVEYS:  Modified Oswestry 16/45   TODAY'S TREATMENT:  Eval    PATIENT EDUCATION: Education details: Discussed eval findings, rehab rationale and POC and patient is in agreement  Person educated: Patient and Caregiver Education method: Explanation Education comprehension: verbalized understanding and needs further education   HOME EXERCISE PROGRAM: TBD    GOALS: Goals reviewed with patient? Yes  SHORT TERM GOALS: Target date: 02/10/22  Patient to demonstrate independence in HEP  Baseline: TBD Goal status: INITIAL  2.  Patient to demo proper posture when cued Baseline: Forward head and shoulders Goal status: INITIAL   LONG TERM GOALS: Target date: 03/03/2022  4/10  worst  pain Baseline: 7/10 worst pain Goal status: INITIAL  2.  Resolve elevated L scapula Baseline: Elevated L scapula Goal status: INITIAL  3.  Decrease ODI score to 10/45 Baseline: 16/45 Goal status: INITIAL  ASSESSMENT:  CLINICAL IMPRESSION: Patient is a 21 y.o. male who was seen today for physical therapy evaluation and treatment for thoracic pain and postural dysfunction.    OBJECTIVE IMPAIRMENTS decreased activity tolerance, decreased knowledge of condition, decreased mobility, decreased strength, improper body mechanics, and postural dysfunction.   ACTIVITY LIMITATIONS carrying, lifting, and standing  PARTICIPATION LIMITATIONS: community activity  PERSONAL FACTORS Age, Fitness, and 1 comorbidity: osteopenia  are also affecting patient's functional outcome.   REHAB POTENTIAL: Good  CLINICAL DECISION MAKING: Evolving/moderate complexity  EVALUATION COMPLEXITY: Moderate  PLAN: PT FREQUENCY: 1x/week  PT DURATION: 6 weeks  PLANNED INTERVENTIONS: Therapeutic exercises, Therapeutic activity, Neuromuscular re-education, Balance training, Gait training, Patient/Family education, Joint mobilization, Stair training, Dry Needling, and Re-evaluation  PLAN FOR NEXT SESSION: HEP, postural training, aerobic work, thoracic mobility, flexibility, L shoulder stability   Hildred Laser, PT 01/20/2022, 5:36 PM  Check all possible CPT codes: 25053 - Re-evaluation, 97110- Therapeutic Exercise, [phone]- Neuro Re-education, [phone] - Gait Training, [phone] - Manual Therapy, and 97530 - Therapeutic Activities     If treatment provided at initial evaluation, no treatment charged due to lack of authorization.

## 2022-01-20 ENCOUNTER — Ambulatory Visit: Payer: Medicaid Other | Attending: Neurosurgery

## 2022-01-20 DIAGNOSIS — R293 Abnormal posture: Secondary | ICD-10-CM | POA: Diagnosis present

## 2022-01-20 DIAGNOSIS — R2681 Unsteadiness on feet: Secondary | ICD-10-CM | POA: Diagnosis present

## 2022-01-20 DIAGNOSIS — M546 Pain in thoracic spine: Secondary | ICD-10-CM | POA: Diagnosis present

## 2022-02-03 ENCOUNTER — Ambulatory Visit: Payer: Medicaid Other

## 2022-02-03 DIAGNOSIS — R2681 Unsteadiness on feet: Secondary | ICD-10-CM

## 2022-02-03 DIAGNOSIS — M546 Pain in thoracic spine: Secondary | ICD-10-CM | POA: Diagnosis not present

## 2022-02-03 DIAGNOSIS — R293 Abnormal posture: Secondary | ICD-10-CM

## 2022-02-03 NOTE — Therapy (Signed)
OUTPATIENT PHYSICAL THERAPY TREATMENT NOTE   Patient Name: Sean Calderon MRN: [id number] DOB:May 26, 2001, 21 y.o., male Today's Date: 02/03/2022  PCP: Tammi Sou, NP REFERRING PROVIDER: Donalee Citrin, MD  END OF SESSION:   PT End of Session - 02/03/22 1652     Visit Number 2    Number of Visits 6    Date for PT Re-Evaluation 03/03/22    Authorization Type [place] MCD    Progress Note Due on Visit 6    PT Start Time 1700    PT Stop Time 1741    PT Time Calculation (min) 41 min    Activity Tolerance Patient tolerated treatment well    Behavior During Therapy WFL for tasks assessed/performed             Past Medical History:  Diagnosis Date   ADHD (attention deficit hyperactivity disorder)    Mental disorder    Psychosis (HCC)    Past Surgical History:  Procedure Laterality Date   WISDOM TOOTH EXTRACTION     Patient Active Problem List   Diagnosis Date Noted   Seizure (HCC) 12/17/2021   Compression fracture of body of thoracic vertebra (HCC) 12/17/2021   Marijuana abuse 12/17/2021   Frequent headaches 06/19/2018   Anxiety state 06/19/2018   Depressed mood 06/19/2018   Vasovagal syncope 06/19/2018   Tension headache 06/19/2018   ADHD (attention deficit hyperactivity disorder), 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 year old male  with anxiety and insomnia who is presenting after his first lifetime seizure.  Per mother seizure happened in the setting of being on Wellbutrin, weeks of poor sleep and using lots of Benadryl.  Currently the Wellbutrin has been discontinued, patient is switched to Prozac, he was advised not to take Benadryl and to use melatonin as needed for  the insomnia.  He will follow-up with his psychiatrist soon.   In terms of his seizure, since this is the first lifetime seizure and EEG and MRI was unrevealing he does not need long-term antiseizure medication.  In the hospital he was started on Keppra for 7 days just to help him while he is coming off Wellbutrin.  Advised the patient to finish the 7-day course that he does not need long-term antiseizure medication after that.  He need to continue follow-up with his primary care doctor, his psychiatrist/therapist and his orthopedic surgeon.  He will return as needed.  All other questions answered.  They are comfortable with plan.  PRECAUTIONS: Fall and Other: seizure  SUBJECTIVE: Patient reports he has pain in his upper back and states "I'm getting used to it at this point."  PAIN:  Are you having pain? Yes: NPRS scale: 6/10 Pain location: thoracic spine Pain description: ache Aggravating factors: lifting prolonged positions Relieving factors: pain patches    OBJECTIVE: (objective measures completed at initial evaluation unless otherwise dated)   DIAGNOSTIC FINDINGS: CLINICAL DATA:  Fall, upper abdominal pain radiating to the back   EXAM: THORACIC SPINE - 3 VIEWS   COMPARISON:  None.   FINDINGS: Mild vertebral body height loss of 2 midthoracic vertebral segments, approximately T6 and T7. Normal alignment. Disc heights are preserved.   IMPRESSION: Mild vertebral body height loss of two adjacent midthoracic vertebral  segments, approximately T6 and T7. Correlate with point tenderness.     Electronically Signed   By: Duanne Guess D.O.   On: 12/17/2021 13:56   COGNITION: Overall cognitive status: Within functional limits for tasks assessed             SENSATION: WFL   COORDINATION: WFL     MUSCLE LENGTH: Not tested     POSTURE: rounded shoulders, forward head, and increased thoracic kyphosis   LOWER EXTREMITY ROM:   WFL throughout   Active  Right Eval  Left Eval  Hip flexion      Hip extension      Hip abduction      Hip adduction      Hip internal rotation      Hip external rotation      Knee flexion      Knee extension      Ankle dorsiflexion      Ankle plantarflexion      Ankle inversion      Ankle eversion       (Blank rows = not tested)   LOWER EXTREMITY MMT:  WFL throughout   MMT Right Eval Left Eval  Hip flexion      Hip extension      Hip abduction      Hip adduction      Hip internal rotation      Hip external rotation      Knee flexion      Knee extension      Ankle dorsiflexion      Ankle plantarflexion      Ankle inversion      Ankle eversion      (Blank rows = not tested)     STAIRS:           WFL   GAIT:            WFL     FUNCTIONAL TESTs:  5 times sit to stand: 10s   PATIENT SURVEYS:  Modified Oswestry 16/45    TODAY'S TREATMENT:  [facility] Adult PT Treatment:                                                DATE: 02/03/2022 Therapeutic Exercise: Nustep level 7 x 5 mins Rows GTB 2x10 Shoulder extension GTB 2x10 High rows 20# 2x10 Low rows 20# 2x10 Palloff press GTB 2x10 BIL Palloff press 7# cable 2x10 BIL Chops 7# cable with dowel x10 BIL Reverse chops 3# cable with dowel x10 BIL Prone YWTi x 10 each  Eval      PATIENT EDUCATION: Education details: Discussed eval findings, rehab rationale and POC and patient is in agreement  Person educated: Patient and Caregiver Education method: Explanation Education comprehension: verbalized understanding and needs further education     HOME EXERCISE PROGRAM: Given 02/03/2022 Access Code: [email or web address] URL: [email or web address] Date: 02/03/2022 Prepared by: Harland German  Exercises - Standing Shoulder Row with Anchored Resistance  - 1 x daily - 7 x weekly - 3 sets - 10 reps - Shoulder extension with resistance - Neutral  - 1 x daily - 7 x weekly - 3 sets - 10 reps - Standing Anti-Rotation Press with Anchored Resistance  - 1 x  daily - 7 x weekly - 3 sets - 10 reps - Prone Single Arm Shoulder Y  - 1 x  daily - 7 x weekly - 2 sets - 10 reps       GOALS: Goals reviewed with patient? Yes   SHORT TERM GOALS: Target date: 02/10/22   Patient to demonstrate independence in HEP  Baseline: TBD Goal status: INITIAL   2.  Patient to demo proper posture when cued Baseline: Forward head and shoulders Goal status: INITIAL     LONG TERM GOALS: Target date: 03/03/2022   4/10 worst pain Baseline: 7/10 worst pain Goal status: INITIAL   2.  Resolve elevated L scapula Baseline: Elevated L scapula Goal status: INITIAL   3.  Decrease ODI score to 10/45 Baseline: 16/45 Goal status: INITIAL   ASSESSMENT:   CLINICAL IMPRESSION: Patient presents to PT with moderate to high pain in his upper back and rounded shoulder/forward head posture. Session today focused on periscapular strengthening. Created HEP and patient demonstrated understanding of each exercise. Plan to incorporate more machines into next session. Patient was able to tolerate all prescribed exercises with no adverse effects. Patient continues to benefit from skilled PT services and should be progressed as able to improve functional independence.      OBJECTIVE IMPAIRMENTS decreased activity tolerance, decreased knowledge of condition, decreased mobility, decreased strength, improper body mechanics, and postural dysfunction.    ACTIVITY LIMITATIONS carrying, lifting, and standing   PARTICIPATION LIMITATIONS: community activity   PERSONAL FACTORS Age, Fitness, and 1 comorbidity: osteopenia  are also affecting patient's functional outcome.    REHAB POTENTIAL: Good   CLINICAL DECISION MAKING: Evolving/moderate complexity   EVALUATION COMPLEXITY: Moderate   PLAN: PT FREQUENCY: 1x/week   PT DURATION: 6 weeks   PLANNED INTERVENTIONS: Therapeutic exercises, Therapeutic activity, Neuromuscular re-education, Balance training, Gait training, Patient/Family  education, Joint mobilization, Stair training, Dry Needling, and Re-evaluation   PLAN FOR NEXT SESSION: HEP, postural training, aerobic work, thoracic mobility, flexibility, L shoulder stability   Harland German, PTA 02/03/22 5:44 PM

## 2022-02-08 ENCOUNTER — Ambulatory Visit: Payer: Medicaid Other

## 2022-02-15 ENCOUNTER — Ambulatory Visit: Payer: Medicaid Other

## 2022-02-21 ENCOUNTER — Ambulatory Visit: Payer: Medicaid Other | Attending: Neurosurgery

## 2022-02-21 DIAGNOSIS — R293 Abnormal posture: Secondary | ICD-10-CM | POA: Insufficient documentation

## 2022-02-21 DIAGNOSIS — R2681 Unsteadiness on feet: Secondary | ICD-10-CM | POA: Diagnosis present

## 2022-02-21 DIAGNOSIS — M546 Pain in thoracic spine: Secondary | ICD-10-CM | POA: Insufficient documentation

## 2022-02-21 NOTE — Therapy (Addendum)
OUTPATIENT PHYSICAL THERAPY TREATMENT NOTE/DC SUMMARY   Patient Name: Sean Calderon MRN: [id number] DOB:[birth year], 21 y.o., male Today's Date: 02/21/2022  PCP: Jacelyn Pi, NP REFERRING PROVIDER: Kary Kos, MD PHYSICAL THERAPY DISCHARGE SUMMARY  Visits from Start of Care: 3  Current functional level related to goals / functional outcomes: UTA has not returned for f/u   Remaining deficits: UTA   Education / Equipment: HEP   Patient agrees to discharge. Patient goals were partially met. Patient is being discharged due to not returning since the last visit.  END OF SESSION:   PT End of Session - 02/21/22 1510     Visit Number 3    Number of Visits 6    Date for PT Re-Evaluation 03/03/22    Authorization Type [place] [facility]    Progress Note Due on Visit 6    PT Start Time 1510   Pt arrived 10 mins late to appt   PT Stop Time 1545    PT Time Calculation (min) 35 min    Activity Tolerance Patient tolerated treatment well    Behavior During Therapy WFL for tasks assessed/performed              Past Medical History:  Diagnosis Date   ADHD (attention deficit hyperactivity disorder)    Mental disorder    Psychosis ([place])    Past Surgical History:  Procedure Laterality Date   WISDOM TOOTH EXTRACTION     Patient Active Problem List   Diagnosis Date Noted   Seizure ([place]) 12/17/2021   Compression fracture of body of thoracic vertebra ([place]) 12/17/2021   Marijuana abuse 12/17/2021   Frequent headaches 06/19/2018   Anxiety state 06/19/2018   Depressed mood 06/19/2018   Vasovagal syncope 06/19/2018   Tension headache 06/19/2018   ADHD (attention deficit hyperactivity disorder), 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   Psychosis ([place]) 12/21/2012    REFERRING DIAG: M85.89 (ICD-10-CM) - Other specified disorders of bone density and structure, multiple sites   THERAPY DIAG:  Pain in thoracic spine  Unsteadiness on feet  Abnormal posture  Rationale for Evaluation and  Treatment Rehabilitation  PERTINENT HISTORY: 21 y.o. year old male  with anxiety and insomnia who is presenting after his first lifetime seizure.  Per mother seizure happened in the setting of being on Wellbutrin, weeks of poor sleep and using lots of Benadryl.  Currently the Wellbutrin has been discontinued, patient is switched to Prozac, he was advised not to take Benadryl and to use melatonin as needed for the insomnia.  He will follow-up with his psychiatrist soon.   In terms of his seizure, since this is the first lifetime seizure and EEG and MRI was unrevealing he does not need long-term antiseizure medication.  In the hospital he was started on Keppra for 7 days just to help him while he is coming off Wellbutrin.  Advised the patient to finish the 7-day course that he does not need long-term antiseizure medication after that.  He need to continue follow-up with his primary care doctor, his psychiatrist/therapist and his orthopedic surgeon.  He will return as needed.  All other questions answered.  They are comfortable with plan.  PRECAUTIONS: Fall and Other: seizure  SUBJECTIVE: Patient reports that he's been doing   PAIN:  Are you having pain? Yes: NPRS scale: 5/10 Pain location: thoracic spine Pain description: ache Aggravating factors: lifting prolonged positions Relieving factors: pain patches    OBJECTIVE: (objective measures completed at initial evaluation unless otherwise dated)   DIAGNOSTIC FINDINGS: CLINICAL  DATA:  Fall, upper abdominal pain radiating to the back   EXAM: THORACIC SPINE - 3 VIEWS   COMPARISON:  None.   FINDINGS: Mild vertebral body height loss of 2 midthoracic vertebral segments, approximately T6 and T7. Normal alignment. Disc heights are preserved.   IMPRESSION: Mild vertebral body height loss of two adjacent midthoracic vertebral segments, approximately T6 and T7. Correlate with point tenderness.     Electronically Signed   By: [name]  D.O.   On: 12/17/2021 13:56   COGNITION: Overall cognitive status: Within functional limits for tasks assessed             SENSATION: WFL   COORDINATION: WFL     MUSCLE LENGTH: Not tested     POSTURE: rounded shoulders, forward head, and increased thoracic kyphosis   LOWER EXTREMITY ROM:   WFL throughout   Active  Right Eval Left Eval  Hip flexion      Hip extension      Hip abduction      Hip adduction      Hip internal rotation      Hip external rotation      Knee flexion      Knee extension      Ankle dorsiflexion      Ankle plantarflexion      Ankle inversion      Ankle eversion       (Blank rows = not tested)   LOWER EXTREMITY MMT:  WFL throughout   MMT Right Eval Left Eval  Hip flexion      Hip extension      Hip abduction      Hip adduction      Hip internal rotation      Hip external rotation      Knee flexion      Knee extension      Ankle dorsiflexion      Ankle plantarflexion      Ankle inversion      Ankle eversion      (Blank rows = not tested)     STAIRS:           WFL   GAIT:            WFL     FUNCTIONAL TESTs:  5 times sit to stand: 10s   PATIENT SURVEYS:  Modified Oswestry 16/45    TODAY'S TREATMENT:  [facility] Adult PT Treatment:                                                DATE: 02/21/2022 Therapeutic Exercise: UBE level 3 x 6 mins (3 fwd/bwd) Lat pull down 20# 2x10 High rows 20# 2x10 Low rows 20# 2x10 Shoulder extension with 7# cables in each hand 2x10 Palloff press 7# cable 2x10 BIL Pball roll up wall with alternating UE lift off x10 Prone on elbows with protraction/retraction x20 Prone press ups x10 Quadruped thoracic mobility with foam roller x10 BIL  [facility] Adult PT Treatment:                                                DATE: 02/03/2022 Therapeutic Exercise: Nustep level 7 x 5 mins Rows GTB 2x10 Shoulder extension GTB  2x10 High rows 20# 2x10 Low rows 20# 2x10 Palloff press GTB 2x10 BIL Palloff press 7#  cable 2x10 BIL Chops 7# cable with dowel x10 BIL Reverse chops 3# cable with dowel x10 BIL Prone YWTi x 10 each  Eval      PATIENT EDUCATION: Education details: Discussed eval findings, rehab rationale and POC and patient is in agreement  Person educated: Patient and Caregiver Education method: Explanation Education comprehension: verbalized understanding and needs further education     HOME EXERCISE PROGRAM: Given 02/03/2022 Access Code: [email or web address] URL: [email or web address] Date: 02/03/2022 Prepared by: Evelene Croon  Exercises - Standing Shoulder Row with Anchored Resistance  - 1 x daily - 7 x weekly - 3 sets - 10 reps - Shoulder extension with resistance - Neutral  - 1 x daily - 7 x weekly - 3 sets - 10 reps - Standing Anti-Rotation Press with Anchored Resistance  - 1 x daily - 7 x weekly - 3 sets - 10 reps - Prone Single Arm Shoulder Y  - 1 x daily - 7 x weekly - 2 sets - 10 reps       GOALS: Goals reviewed with patient? Yes   SHORT TERM GOALS: Target date: 02/10/22   Patient to demonstrate independence in HEP  Baseline: TBD Goal status: MET    2.  Patient to demo proper posture when cued Baseline: Forward head and shoulders Goal status: MET (able to correct when cued, quickly goes back to forward head and rounded shoulders)     LONG TERM GOALS: Target date: 03/03/2022   4/10 worst pain Baseline: 7/10 worst pain Goal status: INITIAL   2.  Resolve elevated L scapula Baseline: Elevated L scapula Goal status: INITIAL   3.  Decrease ODI score to 10/45 Baseline: 16/45 Goal status: INITIAL   ASSESSMENT:   CLINICAL IMPRESSION: Patient presents to PT with continued pain and forward head posture and reports HEP compliance and that the exercises do make him sore. Session today focused on periscapular and postural strengthening as well spinal mobility. He had an increase in lower back pain with prone press ups, less pain when on elbows. Patient  continues to benefit from skilled PT services and should be progressed as able to improve functional independence.      OBJECTIVE IMPAIRMENTS decreased activity tolerance, decreased knowledge of condition, decreased mobility, decreased strength, improper body mechanics, and postural dysfunction.    ACTIVITY LIMITATIONS carrying, lifting, and standing   PARTICIPATION LIMITATIONS: community activity   PERSONAL FACTORS Age, Fitness, and 1 comorbidity: osteopenia  are also affecting patient's functional outcome.    REHAB POTENTIAL: Good   CLINICAL DECISION MAKING: Evolving/moderate complexity   EVALUATION COMPLEXITY: Moderate   PLAN: PT FREQUENCY: 1x/week   PT DURATION: 6 weeks   PLANNED INTERVENTIONS: Therapeutic exercises, Therapeutic activity, Neuromuscular re-education, Balance training, Gait training, Patient/Family education, Joint mobilization, Stair training, Dry Needling, and Re-evaluation   PLAN FOR NEXT SESSION: HEP, postural training, aerobic work, thoracic mobility, flexibility, L shoulder stability   Evelene Croon, PTA 02/21/22 3:45 PM

## 2022-03-02 ENCOUNTER — Ambulatory Visit: Payer: Medicaid Other

## 2022-03-09 ENCOUNTER — Ambulatory Visit: Payer: Medicaid Other

## 2022-03-16 ENCOUNTER — Ambulatory Visit: Payer: Medicaid Other

## 2022-03-16 NOTE — Therapy (Deleted)
OUTPATIENT PHYSICAL THERAPY TREATMENT NOTE   Patient Name: Sean Calderon MRN: [id number] DOB:23-Oct-2000, 21 y.o., male Today's Date: 03/16/2022  PCP: Jacelyn Pi, NP REFERRING PROVIDER: Kary Kos, MD  END OF SESSION:      Past Medical History:  Diagnosis Date   ADHD (attention deficit hyperactivity disorder)    Mental disorder    Psychosis [facility])    Past Surgical History:  Procedure Laterality Date   WISDOM TOOTH EXTRACTION     Patient Active Problem List   Diagnosis Date Noted   Seizure ([place]) 12/17/2021   Compression fracture of body of thoracic vertebra ([place]) 12/17/2021   Marijuana abuse 12/17/2021   Frequent headaches 06/19/2018   Anxiety state 06/19/2018   Depressed mood 06/19/2018   Vasovagal syncope 06/19/2018   Tension headache 06/19/2018   ADHD (attention deficit hyperactivity disorder), 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   Psychosis ([place]) 12/21/2012    REFERRING DIAG: M85.89 (ICD-10-CM) - Other specified disorders of bone density and structure, multiple sites   THERAPY DIAG:  No diagnosis found.  Rationale for Evaluation and Treatment Rehabilitation  PERTINENT HISTORY: 21 y.o. year old male  with anxiety and insomnia who is presenting after his first lifetime seizure.  Per mother seizure happened in the setting of being on Wellbutrin, weeks of poor sleep and using lots of Benadryl.  Currently the Wellbutrin has been discontinued, patient is switched to Prozac, he was advised not to take Benadryl and to use melatonin as needed for the insomnia.  He will follow-up with his psychiatrist soon.   In terms of his seizure, since this is the first lifetime seizure and EEG and MRI was unrevealing he does not need long-term antiseizure medication.  In the hospital he was started on Keppra for 7 days just to help him while he is coming off Wellbutrin.  Advised the patient to finish the 7-day course that he does not need long-term antiseizure medication after that.  He need  to continue follow-up with his primary care doctor, his psychiatrist/therapist and his orthopedic surgeon.  He will return as needed.  All other questions answered.  They are comfortable with plan.  PRECAUTIONS: Fall and Other: seizure  SUBJECTIVE: Patient reports that he's been doing   PAIN:  Are you having pain? Yes: NPRS scale: 5/10 Pain location: thoracic spine Pain description: ache Aggravating factors: lifting prolonged positions Relieving factors: pain patches    OBJECTIVE: (objective measures completed at initial evaluation unless otherwise dated)   DIAGNOSTIC FINDINGS: CLINICAL DATA:  Fall, upper abdominal pain radiating to the back   EXAM: THORACIC SPINE - 3 VIEWS   COMPARISON:  None.   FINDINGS: Mild vertebral body height loss of 2 midthoracic vertebral segments, approximately T6 and T7. Normal alignment. Disc heights are preserved.   IMPRESSION: Mild vertebral body height loss of two adjacent midthoracic vertebral segments, approximately T6 and T7. Correlate with point tenderness.     Electronically Signed   By: [name] D.O.   On: 12/17/2021 13:56   COGNITION: Overall cognitive status: Within functional limits for tasks assessed             SENSATION: WFL   COORDINATION: WFL     MUSCLE LENGTH: Not tested     POSTURE: rounded shoulders, forward head, and increased thoracic kyphosis   LOWER EXTREMITY ROM:   WFL throughout   Active  Right Eval Left Eval  Hip flexion      Hip extension      Hip abduction  Hip adduction      Hip internal rotation      Hip external rotation      Knee flexion      Knee extension      Ankle dorsiflexion      Ankle plantarflexion      Ankle inversion      Ankle eversion       (Blank rows = not tested)   LOWER EXTREMITY MMT:  WFL throughout   MMT Right Eval Left Eval  Hip flexion      Hip extension      Hip abduction      Hip adduction      Hip internal rotation      Hip external  rotation      Knee flexion      Knee extension      Ankle dorsiflexion      Ankle plantarflexion      Ankle inversion      Ankle eversion      (Blank rows = not tested)     STAIRS:           WFL   GAIT:            WFL     FUNCTIONAL TESTs:  5 times sit to stand: 10s   PATIENT SURVEYS:  Modified Oswestry 16/45    TODAY'S TREATMENT:  [facility] Adult PT Treatment:                                                DATE: 02/21/2022 Therapeutic Exercise: UBE level 3 x 6 mins (3 fwd/bwd) Lat pull down 20# 2x10 High rows 20# 2x10 Low rows 20# 2x10 Shoulder extension with 7# cables in each hand 2x10 Palloff press 7# cable 2x10 BIL Pball roll up wall with alternating UE lift off x10 Prone on elbows with protraction/retraction x20 Prone press ups x10 Quadruped thoracic mobility with foam roller x10 BIL  [facility] Adult PT Treatment:                                                DATE: 02/03/2022 Therapeutic Exercise: Nustep level 7 x 5 mins Rows GTB 2x10 Shoulder extension GTB 2x10 High rows 20# 2x10 Low rows 20# 2x10 Palloff press GTB 2x10 BIL Palloff press 7# cable 2x10 BIL Chops 7# cable with dowel x10 BIL Reverse chops 3# cable with dowel x10 BIL Prone YWTi x 10 each  Eval      PATIENT EDUCATION: Education details: Discussed eval findings, rehab rationale and POC and patient is in agreement  Person educated: Patient and Caregiver Education method: Explanation Education comprehension: verbalized understanding and needs further education     HOME EXERCISE PROGRAM: Given 02/03/2022 Access Code: [email or web address] URL: [email or web address] Date: 02/03/2022 Prepared by: Evelene Croon  Exercises - Standing Shoulder Row with Anchored Resistance  - 1 x daily - 7 x weekly - 3 sets - 10 reps - Shoulder extension with resistance - Neutral  - 1 x daily - 7 x weekly - 3 sets - 10 reps - Standing Anti-Rotation Press with Anchored Resistance  - 1 x daily - 7 x weekly - 3 sets -  10 reps - Prone Single Arm Shoulder Y  -  1 x daily - 7 x weekly - 2 sets - 10 reps       GOALS: Goals reviewed with patient? Yes   SHORT TERM GOALS: Target date: 02/10/22   Patient to demonstrate independence in HEP  Baseline: TBD Goal status: MET    2.  Patient to demo proper posture when cued Baseline: Forward head and shoulders Goal status: MET (able to correct when cued, quickly goes back to forward head and rounded shoulders)     LONG TERM GOALS: Target date: 03/03/2022   4/10 worst pain Baseline: 7/10 worst pain Goal status: INITIAL   2.  Resolve elevated L scapula Baseline: Elevated L scapula Goal status: INITIAL   3.  Decrease ODI score to 10/45 Baseline: 16/45 Goal status: INITIAL   ASSESSMENT:   CLINICAL IMPRESSION: Patient presents to PT with continued pain and forward head posture and reports HEP compliance and that the exercises do make him sore. Session today focused on periscapular and postural strengthening as well spinal mobility. He had an increase in lower back pain with prone press ups, less pain when on elbows. Patient continues to benefit from skilled PT services and should be progressed as able to improve functional independence.      OBJECTIVE IMPAIRMENTS decreased activity tolerance, decreased knowledge of condition, decreased mobility, decreased strength, improper body mechanics, and postural dysfunction.    ACTIVITY LIMITATIONS carrying, lifting, and standing   PARTICIPATION LIMITATIONS: community activity   PERSONAL FACTORS Age, Fitness, and 1 comorbidity: osteopenia  are also affecting patient's functional outcome.    REHAB POTENTIAL: Good   CLINICAL DECISION MAKING: Evolving/moderate complexity   EVALUATION COMPLEXITY: Moderate   PLAN: PT FREQUENCY: 1x/week   PT DURATION: 6 weeks   PLANNED INTERVENTIONS: Therapeutic exercises, Therapeutic activity, Neuromuscular re-education, Balance training, Gait training, Patient/Family  education, Joint mobilization, Stair training, Dry Needling, and Re-evaluation   PLAN FOR NEXT SESSION: HEP, postural training, aerobic work, thoracic mobility, flexibility, L shoulder stability   Evelene Croon, PTA 03/16/22 2:44 PM

## 2022-06-22 DIAGNOSIS — E559 Vitamin D deficiency, unspecified: Secondary | ICD-10-CM | POA: Insufficient documentation

## 2022-06-22 DIAGNOSIS — M8080XD Other osteoporosis with current pathological fracture, unspecified site, subsequent encounter for fracture with routine healing: Secondary | ICD-10-CM | POA: Insufficient documentation

## 2023-02-15 ENCOUNTER — Ambulatory Visit: Payer: Medicaid Other | Attending: Pediatrics | Admitting: Physical Therapy

## 2023-02-21 ENCOUNTER — Other Ambulatory Visit: Payer: Self-pay

## 2023-02-21 ENCOUNTER — Inpatient Hospital Stay (HOSPITAL_COMMUNITY)
Admission: AD | Admit: 2023-02-21 | Discharge: 2023-02-27 | DRG: 885 | Disposition: A | Discharge status: Home / Self Care | Payer: Medicaid Other | Source: Intra-hospital | Attending: Psychiatry | Admitting: Psychiatry

## 2023-02-21 ENCOUNTER — Encounter (HOSPITAL_COMMUNITY): Payer: Self-pay | Admitting: Emergency Medicine

## 2023-02-21 ENCOUNTER — Emergency Department (HOSPITAL_COMMUNITY)
Admission: EM | Admit: 2023-02-21 | Discharge: 2023-02-21 | Disposition: A | Discharge status: Other Acute Inpatient Hospital | Payer: Medicaid Other | Attending: Emergency Medicine | Admitting: Emergency Medicine

## 2023-02-21 ENCOUNTER — Encounter (HOSPITAL_COMMUNITY): Payer: Self-pay | Admitting: Nurse Practitioner

## 2023-02-21 DIAGNOSIS — Y900 Blood alcohol level of less than 20 mg/100 ml: Secondary | ICD-10-CM | POA: Insufficient documentation

## 2023-02-21 DIAGNOSIS — R45851 Suicidal ideations: Secondary | ICD-10-CM

## 2023-02-21 DIAGNOSIS — G4700 Insomnia, unspecified: Secondary | ICD-10-CM | POA: Diagnosis present

## 2023-02-21 DIAGNOSIS — F32A Depression, unspecified: Secondary | ICD-10-CM | POA: Insufficient documentation

## 2023-02-21 DIAGNOSIS — F1910 Other psychoactive substance abuse, uncomplicated: Secondary | ICD-10-CM | POA: Insufficient documentation

## 2023-02-21 DIAGNOSIS — F411 Generalized anxiety disorder: Secondary | ICD-10-CM | POA: Diagnosis present

## 2023-02-21 DIAGNOSIS — Z91199 Patient's noncompliance with other medical treatment and regimen due to unspecified reason: Secondary | ICD-10-CM

## 2023-02-21 DIAGNOSIS — G8929 Other chronic pain: Secondary | ICD-10-CM | POA: Diagnosis present

## 2023-02-21 DIAGNOSIS — F332 Major depressive disorder, recurrent severe without psychotic features: Secondary | ICD-10-CM | POA: Diagnosis present

## 2023-02-21 LAB — CBC
HCT: 45.5 % (ref 39.0–52.0)
Hemoglobin: 14.9 g/dL (ref 13.0–17.0)
MCH: 29.7 pg (ref 26.0–34.0)
MCHC: 32.7 g/dL (ref 30.0–36.0)
MCV: 90.6 fL (ref 80.0–100.0)
Platelets: 357 10*3/uL (ref 150–400)
RBC: 5.02 MIL/uL (ref 4.22–5.81)
RDW: 12.7 % (ref 11.5–15.5)
WBC: 5.5 10*3/uL (ref 4.0–10.5)
nRBC: 0 % (ref 0.0–0.2)

## 2023-02-21 LAB — COMPREHENSIVE METABOLIC PANEL
ALT: 14 U/L (ref 0–44)
AST: 18 U/L (ref 15–41)
Albumin: 4.7 g/dL (ref 3.5–5.0)
Alkaline Phosphatase: 52 U/L (ref 38–126)
Anion gap: 8 (ref 5–15)
BUN: 18 mg/dL (ref 6–20)
CO2: 24 mmol/L (ref 22–32)
Calcium: 9.5 mg/dL (ref 8.9–10.3)
Chloride: 103 mmol/L (ref 98–111)
Creatinine, Ser: 0.9 mg/dL (ref 0.61–1.24)
GFR, Estimated: >60 mL/min (ref >60)
Glucose, Bld: 96 mg/dL (ref 70–99)
Potassium: 3.7 mmol/L (ref 3.5–5.1)
Sodium: 135 mmol/L (ref 135–145)
Total Bilirubin: 0.4 mg/dL (ref 0.3–1.2)
Total Protein: 8.4 g/dL — ABNORMAL HIGH (ref 6.5–8.1)

## 2023-02-21 LAB — SALICYLATE LEVEL: Salicylate Lvl: <7 mg/dL — ABNORMAL LOW (ref 7.0–30.0)

## 2023-02-21 LAB — ETHANOL: Alcohol, Ethyl (B): <10 mg/dL (ref <10)

## 2023-02-21 LAB — ACETAMINOPHEN LEVEL: Acetaminophen (Tylenol), Serum: <10 ug/mL — ABNORMAL LOW (ref 10–30)

## 2023-02-21 MED ORDER — TRAZODONE HCL 50 MG PO TABS
50.0000 mg | ORAL_TABLET | Freq: Every evening | ORAL | Status: DC | PRN
  Administered 2023-02-21 – 2023-02-24 (×3): 50 mg via ORAL
  Filled 2023-02-21 (×3): qty 1

## 2023-02-21 MED ORDER — ACETAMINOPHEN 325 MG PO TABS
650.0000 mg | ORAL_TABLET | Freq: Four times a day (QID) | ORAL | Status: DC | PRN
  Administered 2023-02-23 – 2023-02-25 (×3): 650 mg via ORAL
  Filled 2023-02-21 (×3): qty 2

## 2023-02-21 MED ORDER — MAGNESIUM HYDROXIDE 400 MG/5ML PO SUSP: 30.0000 mL | Freq: Every day | ORAL | Status: DC | PRN

## 2023-02-21 MED ORDER — HALOPERIDOL LACTATE 5 MG/ML IJ SOLN: 5.0000 mg | Freq: Three times a day (TID) | INTRAMUSCULAR | Status: DC | PRN

## 2023-02-21 MED ORDER — LORAZEPAM 0.5 MG PO TABS
0.5000 mg | ORAL_TABLET | Freq: Once | ORAL | Status: AC
  Administered 2023-02-21: 0.5 mg via ORAL
  Filled 2023-02-21: qty 1

## 2023-02-21 MED ORDER — OYSTER SHELL CALCIUM/D3 500-5 MG-MCG PO TABS
1.0000 | ORAL_TABLET | Freq: Every day | ORAL | Status: DC
  Administered 2023-02-22 – 2023-02-27 (×6): 1 via ORAL
  Filled 2023-02-21 (×8): qty 1

## 2023-02-21 MED ORDER — DIPHENHYDRAMINE HCL 25 MG PO CAPS: 50.0000 mg | ORAL_CAPSULE | Freq: Three times a day (TID) | ORAL | Status: DC | PRN

## 2023-02-21 MED ORDER — VITAMIN D3 25 MCG PO TABS
1000.0000 [IU] | ORAL_TABLET | Freq: Two times a day (BID) | ORAL | Status: DC
  Administered 2023-02-21 – 2023-02-27 (×12): 1000 [IU] via ORAL
  Filled 2023-02-21 (×20): qty 1

## 2023-02-21 MED ORDER — LORAZEPAM 2 MG/ML IJ SOLN: 2.0000 mg | Freq: Three times a day (TID) | INTRAMUSCULAR | Status: DC | PRN

## 2023-02-21 MED ORDER — ESCITALOPRAM OXALATE 10 MG PO TABS: 5.0000 mg | ORAL_TABLET | Freq: Every day | ORAL | Status: DC

## 2023-02-21 MED ORDER — HALOPERIDOL 5 MG PO TABS
5.0000 mg | ORAL_TABLET | Freq: Three times a day (TID) | ORAL | Status: DC | PRN
  Administered 2023-02-22: 5 mg via ORAL
  Filled 2023-02-21: qty 1

## 2023-02-21 MED ORDER — ESCITALOPRAM OXALATE 5 MG PO TABS
5.0000 mg | ORAL_TABLET | Freq: Every day | ORAL | Status: DC
  Administered 2023-02-22: 5 mg via ORAL
  Filled 2023-02-21 (×2): qty 1

## 2023-02-21 MED ORDER — HYDROXYZINE HCL 25 MG PO TABS
25.0000 mg | ORAL_TABLET | Freq: Three times a day (TID) | ORAL | Status: DC | PRN
  Administered 2023-02-21 – 2023-02-26 (×9): 25 mg via ORAL
  Filled 2023-02-21 (×9): qty 1

## 2023-02-21 MED ORDER — HYDROXYZINE HCL 25 MG PO TABS: 25.0000 mg | ORAL_TABLET | Freq: Three times a day (TID) | ORAL | Status: DC | PRN

## 2023-02-21 MED ORDER — MELATONIN 5 MG PO TABS: 5.0000 mg | ORAL_TABLET | Freq: Every day | ORAL | Status: DC

## 2023-02-21 MED ORDER — LORAZEPAM 1 MG PO TABS
2.0000 mg | ORAL_TABLET | Freq: Three times a day (TID) | ORAL | Status: DC | PRN
  Administered 2023-02-22: 2 mg via ORAL
  Filled 2023-02-21: qty 2

## 2023-02-21 MED ORDER — MELATONIN 5 MG PO TABS
5.0000 mg | ORAL_TABLET | Freq: Every day | ORAL | Status: DC
  Administered 2023-02-21 – 2023-02-26 (×6): 5 mg via ORAL
  Filled 2023-02-21 (×9): qty 1

## 2023-02-21 MED ORDER — DIPHENHYDRAMINE HCL 50 MG/ML IJ SOLN: 50.0000 mg | Freq: Three times a day (TID) | INTRAMUSCULAR | Status: DC | PRN

## 2023-02-21 MED ORDER — ALUM & MAG HYDROXIDE-SIMETH 200-200-20 MG/5ML PO SUSP: 30.0000 mL | ORAL | Status: DC | PRN

## 2023-02-21 NOTE — ED Notes (Addendum)
Pt provided with burgundy scrubs and instructed to change into them while putting his personal belongings in pt belonging bags.

## 2023-02-21 NOTE — ED Provider Notes (Signed)
Care assumed from Sean Calderon, [place] at shift change. Please see their note for further information.   Briefly: Patient presents with suicidal ideation. No plan, no previous attempts. He is voluntary.  Plan: Medical clearance labs pending. If normal, patient Sean be medically cleared for TTS consult. He is voluntary, if he tries to leave he would need to be placed under IVC. Patient is aware of this.   Labs have resulted and are unremarkable for acute findings.   Sean consult TTS and appreciate their recommendations.     Vear Clock 02/21/23 4098    Arby Barrette, MD 02/21/23 [phone]

## 2023-02-21 NOTE — ED Provider Notes (Signed)
[place] EMERGENCY DEPARTMENT AT [facility] Provider Note   CSN: [id number] Arrival date & time: 02/21/23  0347     History  Chief Complaint  Patient presents with   Psychiatric Evaluation    Sean Calderon is a 22 y.o. male.  HPI   Patient with medical history including seizures, vitamin D deficiency, osteoporosis, with thoracic spine compression fracture presented with complaints of suicidal ideations.  Patient states that he has been having difficulty with sleeping, he has been unable to sleep since last Monday, states he is slept for approximately 6 hours, states that he feels very anxious and this seems to be exacerbating his sleep disorder.  He states that because of this he has been having suicidal thoughts, has no specific plan, he is never attempted suicide in the past, but has had that with suicidal ideations.  He states partly the reason why tentatively sleeping is because he has chronic back pain, states this is unchanged, has been taking his medications, is not any worse than usual, no paresthesia or weakness moving down his leg no urinary or bowel incontinency's.  He denies any illicit drug use, denies any alcohol use, states he has been trying to take Vistaril without much relief.    Home Medications Prior to Admission medications   Medication Sig Start Date End Date Taking? Authorizing Provider  acetaminophen (TYLENOL) 500 MG tablet Take 1 tablet (500 mg total) by mouth every 4 (four) hours as needed for mild pain or moderate pain. 12/19/21   Lewie Chamber, MD  calcium-vitamin D [place] Rummage WITH D) 500-5 MG-MCG tablet Take 1 tablet by mouth daily with breakfast. 12/20/21   Lewie Chamber, MD  diazePAM, 15 MG Dose, (VALTOCO 15 MG DOSE) 2 x 7.5 MG/0.1ML LQPK Place 7.5 mg in each nostril for a seizure if lasting more than 5 minutes 12/19/21   Lewie Chamber, MD  FLUoxetine (PROZAC) 20 MG capsule Take 1 capsule (20 mg total) by mouth daily. 12/19/21 12/19/22  Lewie Chamber, MD  levETIRAcetam (KEPPRA) 500 MG tablet Take 1 tablet (500 mg total) by mouth 2 (two) times daily for 7 days. 12/19/21 12/26/21  Lewie Chamber, MD      Allergies    Bupropion    Review of Systems   Review of Systems  Constitutional:  Negative for chills and fever.  Respiratory:  Negative for shortness of breath.   Cardiovascular:  Negative for chest pain.  Gastrointestinal:  Negative for abdominal pain.  Neurological:  Negative for headaches.    Physical Exam Updated Vital Signs BP 121/78 (BP Location: Left Arm)   Pulse 65   Temp 98.3 F (36.8 C) (Oral)   Resp 16   Ht 5\' 1"  (1.549 m)   Wt 55 kg   SpO2 100%   BMI 22.91 kg/m  Physical Exam Vitals and nursing note reviewed.  Constitutional:      General: He is not in acute distress.    Appearance: He is not ill-appearing.  HENT:     Head: Normocephalic and atraumatic.     Nose: No congestion.  Eyes:     Conjunctiva/sclera: Conjunctivae normal.  Cardiovascular:     Rate and Rhythm: Normal rate and regular rhythm.     Pulses: Normal pulses.     Heart sounds: No murmur heard.    No friction rub. No gallop.  Pulmonary:     Effort: No respiratory distress.     Breath sounds: No wheezing, rhonchi or rales.  Abdominal:  Palpations: Abdomen is soft.     Tenderness: There is no abdominal tenderness. There is no right CVA tenderness or left CVA tenderness.  Skin:    General: Skin is warm and dry.  Neurological:     Mental Status: He is alert.  Psychiatric:        Mood and Affect: Mood normal.     Comments: Patient makes poor eye contact, has flat affect, visibly upset on my exam, endorsing passive suicidal ideations, no active plan, denies any homicidal ideations.     ED Results / Procedures / Treatments   Labs (all labs ordered are listed, but only abnormal results are displayed) Labs Reviewed  CBC  COMPREHENSIVE METABOLIC PANEL  ETHANOL  SALICYLATE LEVEL  ACETAMINOPHEN LEVEL  RAPID URINE DRUG SCREEN,  HOSP PERFORMED    EKG None  Radiology No results found.  Procedures Procedures    Medications Ordered in ED Medications  LORazepam (ATIVAN) tablet 0.5 mg (has no administration in time range)    ED Course/ Medical Decision Making/ A&P                             Medical Decision Making Amount and/or Complexity of Data Reviewed Labs: ordered.  Risk Prescription drug management.   This patient presents to the ED for concern of psychiatric emergency, this involves an extensive number of treatment options, and is a complaint that carries with it a high risk of complications and morbidity.  The differential diagnosis includes withdrawals, metabolic abnormality, psychiatric emergency    Additional history obtained:  Additional history obtained from N/A External records from outside source obtained and reviewed including pain clinic notes   Co morbidities that complicate the patient evaluation  Osteoporosis, chronic pain  Social Determinants of Health:  N/A    Lab Tests:  I Ordered, and personally interpreted labs.  The pertinent results include: CBC unremarkable   Imaging Studies ordered:  I ordered imaging studies including n/a  I independently visualized and interpreted imaging which showed n/a I agree with the radiologist interpretation   Cardiac Monitoring:  The patient was maintained on a cardiac monitor.  I personally viewed and interpreted the cardiac monitored which showed an underlying rhythm of: n/a   Medicines ordered and prescription drug management:  I ordered medication including Ativan I have reviewed the patients home medicines and have made adjustments as needed  Critical Interventions:  N/a   Reevaluation:  Presents with SI, patient is here voluntarily, will obtain medical clearance workup, provide with Ativan and continue to monitor.  Consultations Obtained:  N/a    Test Considered:  N/a    Rule out Doubt  withdrawal nontremulous my exam vital signs reassuring presentation atypical of etiology.    Dispostion and problem list  Due to shift change patient be handed off to Lurena Nida, [facility]   Follow-up on lab workup, once patient is medically cleared placed in psych hold, and await TTS recommendations, patient attempts to leave I would IVC him, make sure home meds have been ordered.            Final Clinical Impression(s) / ED Diagnoses Final diagnoses:  Suicidal ideation    Rx / DC Orders ED Discharge Orders     None         Carroll Sage, PA-C 02/21/23 0981    Gloris Manchester, MD 02/21/23 [phone]

## 2023-02-21 NOTE — Progress Notes (Signed)
Pt was accepted to [facility] TODAY 02/21/2023. Bed assignment: 406-1  Pt meets inpatient criteria per Dahlia Byes, NP  Attending Physician will be Phineas Inches, MD  Report can be called to: - Adult unit: [phone]  Pt can arrive anytime; bed is ready now  Care Team Notified: [facility] Rona Ravens, RN, Latricia Heft, Paramedic, April Wilson, Paramedic, Karn Pickler, RN, Dahlia Byes, NP, and Carleene Overlie, RN  [place], [place]  02/21/2023 1:32 PM

## 2023-02-21 NOTE — ED Notes (Signed)
Explained behavioral policy to pt and brother and that we would be removing belongings for both the pt's safety as well as staff safety. Pt and brother both understood and agreeable. Pt's brother left and shortly thereafter, pt asked if he could leave since he voluntarily came in. PA Chrissie Noa notified and came to bedside to speak with pt.

## 2023-02-21 NOTE — Consult Note (Signed)
[facility] ED ASSESSMENT   Reason for Consult:  Psychiatry evaluation  Referring Physician:  ER Physician Patient Identification: Sean Calderon MRN:  [id number] ED Chief Complaint: Generalized anxiety disorder  Diagnosis:  Principal Problem:   Generalized anxiety disorder Active Problems:   Insomnia disorder   Suicide ideation   ED Assessment Time Calculation: Start Time: 0958 Stop Time: 1019 Total Time in Minutes (Assessment Completion): 21   Subjective:   Sean Calderon is a 22 y.o. male patient admitted with previous hx of Depression, anxiety, Cannabis Abuse and Psychosis came in last night with complaint of severe insomnia and anxiety.that makes him feel suicidal.  Patient reports no sleep in 8 days.  HPI:  Patient was seen this morning sleeping but easily aroused.  He reports psychiatry hx of Depression and anxiety and states his PCP prescribed Hydroxyzine for him.  Patient reports that for eight days he has not has much sleep-averages 3-4 hours of sleep day and night.  He reports that his anxiety gradually increased due to inability to sleep.  He added that without sleep he became suicidal since yesterday and remains suicidal this morning with no plan.  Patient admits to smoking Marijuana twice a day and denies any other drug use. 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  He has been dealing with depression and anxiety and cannabis use since teenage age.  He was hospitalized at [facility] in Fifth Grade and Middle school.  He has doing well since then but smokes Marijuana twice a day.  He reports poor sleep and appetite.  He is out of school for summer vacation but has no summer job.  He is in agreement to come into inpatient Psychiatry setting for management of anxiety and insomnia.  We discussed the need to stop using Marijuana and other illicit drug use.  Patient denies HI/AVH and no mention of Paranoia.  We will resume home Medications and seek bed placement.  Records will be  faxed out to facilities with available beds.  Past Psychiatric History: Previous hx of Depression, anxiety, Cannabis Abuse and Psychosis .  Two previous inpatient Mental health care hospitalizations at [facility] as a child and teenager.  No current outpatient Psychiatrist.  Risk to Self or Others: Is the patient at risk to self? Yes Has the patient been a risk to self in the past 6 months? No Has the patient been a risk to self within the distant past? Yes Is the patient a risk to others? No Has the patient been a risk to others in the past 6 months? No Has the patient been a risk to others within the distant past? No  [place] Scale:  Flowsheet Row ED from 02/21/2023 in [facility] Emergency Department at [facility] ED to Hosp-Admission (Discharged) from 12/17/2021 in [place] [facility]  C-SSRS RISK CATEGORY Low Risk Low Risk       AIMS:  , , ,  ,   ASAM:    Substance Abuse:     Past Medical History:  Past Medical History:  Diagnosis Date   ADHD (attention deficit hyperactivity disorder)    Mental disorder    Psychosis (HCC)     Past Surgical History:  Procedure Laterality Date   WISDOM TOOTH EXTRACTION     Family History:  Family History  Problem Relation Age of Onset   Migraines Brother    Seizures Neg Hx    Depression Neg Hx    Anxiety disorder Neg  Hx    ADD / ADHD Neg Hx    Autism Neg Hx    Family Psychiatric  History: One brother suffered from Learning disorder, was on Adderall. Social History:  Social History   Substance and Sexual Activity  Alcohol Use No     Social History   Substance and Sexual Activity  Drug Use Yes   Types: Marijuana   Comment: Previously, using once weekly. None since hospital on 12/17/21.    Social History   Socioeconomic History   Marital status: Single    Spouse name: Not on file   Number of children: 0   Years of education: college student   Highest education level: Not on file  Occupational History    Not on file  Tobacco Use   Smoking status: Never   Smokeless tobacco: Never  Vaping Use   Vaping Use: Never used  Substance and Sexual Activity   Alcohol use: No   Drug use: Yes    Types: Marijuana    Comment: Previously, using once weekly. None since hospital on 12/17/21.   Sexual activity: Never  Other Topics Concern   Not on file  Social History Narrative   He lives with his mother and brother during breaks from school. Lives in dorm during semesters.   Right-handed.   Archivist at [employer].   Social Determinants of Health   Financial Resource Strain: Not on file  Food Insecurity: Not on file  Transportation Needs: Not on file  Physical Activity: Not on file  Stress: Not on file  Social Connections: Not on file   Additional Social History:    Allergies:   Allergies  Allergen Reactions   Bupropion Other (See Comments)    seizure    Labs:  Results for orders placed or performed during the hospital encounter of 02/21/23 (from the past 48 hour(s))  Comprehensive metabolic panel     Status: Abnormal   Collection Time: 02/21/23  5:58 AM  Result Value Ref Range   Sodium 135 135 - 145 mmol/L   Potassium 3.7 3.5 - 5.1 mmol/L   Chloride 103 98 - 111 mmol/L   CO2 24 22 - 32 mmol/L   Glucose, Bld 96 70 - 99 mg/dL    Comment: Glucose reference range applies only to samples taken after fasting for at least 8 hours.   BUN 18 6 - 20 mg/dL   Creatinine, Ser 8.29 0.61 - 1.24 mg/dL   Calcium 9.5 8.9 - 56.2 mg/dL   Total Protein 8.4 (H) 6.5 - 8.1 g/dL   Albumin 4.7 3.5 - 5.0 g/dL   AST 18 15 - 41 U/L   ALT 14 0 - 44 U/L   Alkaline Phosphatase 52 38 - 126 U/L   Total Bilirubin 0.4 0.3 - 1.2 mg/dL   GFR, Estimated >13 >08 mL/min    Comment: (NOTE) Calculated using the CKD-EPI Creatinine Equation (2021)    Anion gap 8 5 - 15    Comment: Performed at [facility], [address], [place], [place] [address]  Ethanol     Status: None   Collection Time:  02/21/23  5:58 AM  Result Value Ref Range   Alcohol, Ethyl (B) <10 <10 mg/dL    Comment: (NOTE) Lowest detectable limit for serum alcohol is 10 mg/dL.  For medical purposes only. Performed at [facility], [address], [place], [place] [address]   Salicylate level     Status: Abnormal   Collection  Time: 02/21/23  5:58 AM  Result Value Ref Range   Salicylate Lvl <7.0 (L) 7.0 - 30.0 mg/dL    Comment: Performed at [facility], [address] [place], [place] [address]  Acetaminophen level     Status: Abnormal   Collection Time: 02/21/23  5:58 AM  Result Value Ref Range   Acetaminophen (Tylenol), Serum <10 (L) 10 - 30 ug/mL    Comment: (NOTE) Therapeutic concentrations vary significantly. A range of 10-30 ug/mL  may be an effective concentration for many patients. However, some  are best treated at concentrations outside of this range. Acetaminophen concentrations >150 ug/mL at 4 hours after ingestion  and >50 ug/mL at 12 hours after ingestion are often associated with  toxic reactions.  Performed at [facility], [address], [place], [place] [address]   cbc     Status: None   Collection Time: 02/21/23  5:58 AM  Result Value Ref Range   WBC 5.5 4.0 - 10.5 K/uL   RBC 5.02 4.22 - 5.81 MIL/uL   Hemoglobin 14.9 13.0 - 17.0 g/dL   HCT 21.3 08.6 - 57.8 %   MCV 90.6 80.0 - 100.0 fL   MCH 29.7 26.0 - 34.0 pg   MCHC 32.7 30.0 - 36.0 g/dL   RDW 46.9 62.9 - 52.8 %   Platelets 357 150 - 400 K/uL   nRBC 0.0 0.0 - 0.2 %    Comment: Performed at [facility], [address], [place], [place] [address]    Current Facility-Administered Medications  Medication Dose Route Frequency Provider Last Rate Last Admin   escitalopram (LEXAPRO) tablet 5 mg  5 mg Oral Daily Jesyca Weisenburger C, NP       hydrOXYzine (ATARAX) tablet 25 mg  25 mg Oral TID PRN Dahlia Byes C, NP       melatonin tablet 5 mg  5 mg  Oral QHS Dahlia Byes C, NP       Current Outpatient Medications  Medication Sig Dispense Refill   calcium-vitamin D (OSCAL WITH D) 500-5 MG-MCG tablet Take 1 tablet by mouth daily with breakfast.     cholecalciferol (VITAMIN D3) 25 MCG (1000 UNIT) tablet Take 1,000 Units by mouth 2 (two) times daily.     diclofenac (VOLTAREN) 75 MG EC tablet Take 75 mg by mouth 2 (two) times daily.     hydrOXYzine (ATARAX) 25 MG tablet Take 50 mg by mouth See admin instructions. Take 2 Tablets by mouth nightly at bedtime as needed for sleep or anxiety (May take once during day if needed to help with anxiety) for up to 30 days     acetaminophen (TYLENOL) 500 MG tablet Take 1 tablet (500 mg total) by mouth every 4 (four) hours as needed for mild pain or moderate pain. (Patient not taking: Reported on 02/21/2023) 30 tablet 0   diazePAM, 15 MG Dose, (VALTOCO 15 MG DOSE) 2 x 7.5 MG/0.1ML LQPK Place 7.5 mg in each nostril for a seizure if lasting more than 5 minutes (Patient taking differently: Place 7.5 mg into the nose See admin instructions. Place 7.5 mg in each nostril for a seizure if lasting more than 5 minutes) 2 each 2   FLUoxetine (PROZAC) 20 MG capsule Take 1 capsule (20 mg total) by mouth daily. (Patient not taking: Reported on 02/21/2023) 30 capsule 2   levETIRAcetam (KEPPRA) 500 MG tablet Take 1 tablet (500 mg total) by mouth 2 (two) times daily for 7 days. (  Patient not taking: Reported on 02/21/2023) 14 tablet 0    Musculoskeletal: Strength & Muscle Tone: within normal limits Gait & Station: normal Patient leans: Front   Psychiatric Specialty Exam: Presentation  General Appearance:  Neat; Casual  Eye Contact: Good  Speech: Clear and Coherent; Normal Rate  Speech Volume: Normal  Handedness: Right   Mood and Affect  Mood: Anxious  Affect: Congruent   Thought Process  Thought Processes: Coherent; Goal Directed; Linear  Descriptions of Associations:Intact  Orientation:Full  (Time, Place and Person)  Thought Content:Logical  History of Schizophrenia/Schizoaffective disorder:No data recorded Duration of Psychotic Symptoms:No data recorded Hallucinations:Hallucinations: None  Ideas of Reference:None  Suicidal Thoughts:Suicidal Thoughts: Yes, Active SI Active Intent and/or Plan: Without Plan  Homicidal Thoughts:Homicidal Thoughts: No   Sensorium  Memory: Immediate Good; Recent Good; Remote Good  Judgment: Intact  Insight: Present   Executive Functions  Concentration: Good  Attention Span: Good  Recall: Good  Fund of Knowledge: Good  Language: Good   Psychomotor Activity  Psychomotor Activity: Psychomotor Activity: Normal   Assets  Assets: Communication Skills; Desire for Improvement; Housing; Physical Health    Sleep  Sleep: Sleep: Poor   Physical Exam: Physical Exam Vitals and nursing note reviewed.  Constitutional:      Appearance: Normal appearance.  HENT:     Head: Normocephalic.     Nose: Nose normal.  Cardiovascular:     Rate and Rhythm: Normal rate and regular rhythm.  Pulmonary:     Effort: Pulmonary effort is normal.  Musculoskeletal:        General: Normal range of motion.     Cervical back: Normal range of motion.  Skin:    General: Skin is warm and dry.  Neurological:     Mental Status: He is alert and oriented to person, place, and time.  Psychiatric:        Attention and Perception: Attention and perception normal.        Mood and Affect: Mood is anxious and depressed.        Speech: Speech normal.        Behavior: Behavior normal. Behavior is cooperative.        Thought Content: Thought content includes suicidal ideation.        Cognition and Memory: Cognition and memory normal.        Judgment: Judgment normal.    Review of Systems  Constitutional: Negative.   HENT: Negative.    Eyes: Negative.   Respiratory: Negative.    Cardiovascular: Negative.   Gastrointestinal: Negative.    Genitourinary: Negative.   Musculoskeletal: Negative.   Skin: Negative.   Neurological:  Positive for seizures.  Endo/Heme/Allergies: Negative.   Psychiatric/Behavioral:  Positive for depression, substance abuse and suicidal ideas. The patient is nervous/anxious and has insomnia.    Blood pressure 121/78, pulse 65, temperature 98.3 F (36.8 C), temperature source Oral, resp. rate 16, height 5\' 1"  (1.549 m), weight 55 kg, SpO2 100 %. Body mass index is 22.91 kg/m.  Medical Decision Making: Patient meets criteria for inpatient Mental healthcare.  We will seek bed placement at any facility with available beds.  We will resume home medications.  Patient remains suicidal at this time with no plan.  Problem 1: Generalized anxiety disorder  Problem 2: Suicide ideation  Problem 3: Insomnia.  Disposition:  admit seek bed placement.  Earney Navy, NP-PMHNP-BC 02/21/2023 10:23 AM

## 2023-02-21 NOTE — Group Note (Signed)
Date:  02/21/2023 Time:  9:09 PM  Group Topic/Focus:  Wrap-Up Group:   The focus of this group is to help patients review their daily goal of treatment and discuss progress on daily workbooks.    Participation Level:  Minimal  Participation Quality:  Attentive  Affect:  Appropriate  Cognitive:  Appropriate  Insight: Appropriate  Engagement in Group:  Engaged  Modes of Intervention:   none  Additional Comments:  pt rated goal to be 5 and planned on staying calm and sleeping tomorrow  Emalee Knies E Rayshard Schirtzinger 02/21/2023, 9:09 PM

## 2023-02-21 NOTE — Progress Notes (Signed)
   02/21/23 1600  Psych Admission Type (Psych Patients Only)  Admission Status Voluntary  Psychosocial Assessment  Patient Complaints Anxiety;Depression;Insomnia  Eye Contact Fair  Facial Expression Sad;Anxious  Affect Anxious;Sad;Depressed  Speech Logical/coherent  Interaction Guarded;Cautious  Motor Activity Other (Comment) (WDL)  Appearance/Hygiene Unremarkable  Behavior Characteristics Cooperative;Appropriate to situation;Anxious  Mood Depressed;Anxious;Sad  Thought Process  Coherency WDL  Content WDL  Delusions None reported or observed  Perception WDL  Hallucination None reported or observed  Judgment Impaired  Confusion None  Danger to Self  Current suicidal ideation? Passive  Self-Injurious Behavior No self-injurious ideation or behavior indicators observed or expressed   Agreement Not to Harm Self Yes  Description of Agreement verbal  Danger to Others  Danger to Others None reported or observed

## 2023-02-21 NOTE — ED Triage Notes (Signed)
Pt c/o not being able to sleep x 1 week and feels like the lack of sleep is affecting his mental health. Pt endorses SI, denies HI/AH/VH.

## 2023-02-21 NOTE — Tx Team (Signed)
Initial Treatment Plan 02/21/2023 4:45 PM Sean Calderon UJW:[id number]    PATIENT STRESSORS: Other: insomnia     PATIENT STRENGTHS: Communication skills  General fund of knowledge  Motivation for treatment/growth  Physical Health    PATIENT IDENTIFIED PROBLEMS: insomnia  anxiety  depression                 DISCHARGE CRITERIA:  Motivation to continue treatment in a less acute level of care Reduction of life-threatening or endangering symptoms to within safe limits  PRELIMINARY DISCHARGE PLAN: Outpatient therapy  PATIENT/FAMILY INVOLVEMENT: This treatment plan has been presented to and reviewed with the patient, Sean Calderon.  The patient has been given the opportunity to ask questions and make suggestions.  Karn Pickler, RN 02/21/2023, 4:45 PM

## 2023-02-21 NOTE — Progress Notes (Signed)
Admission note:   Patient is a 22 y.o. male who presented to [facility] for depression and suicidal ideation related to increased insomnia over the last week. Patient is tearful and anxious on admission and says "I'm just overwhelmed here." Patient endorses passive SI with no plan and verbally contracts for safety. Patient denies HI, and AVH. Skin assessment was completed with Vikki Ports, RN. Patient was provided admission packet, bill of rights and provided opportunity to ask questions anduse the phone. Patient was oriented to the unit and Q 15 minute safety checks were initiated.

## 2023-02-21 NOTE — ED Notes (Addendum)
Pt wanded by security and walked back to [facility] room 36. Pt belongings transferred and placed in locker #36  Pt belongings include 2 pt belonging bags (1 with pt's clothing and 1 with pt's shoes) as well as a black/gray bookbag. Pt's wallet and cell phone are in backpack.

## 2023-02-22 ENCOUNTER — Encounter (HOSPITAL_COMMUNITY): Payer: Self-pay | Admitting: Nurse Practitioner

## 2023-02-22 DIAGNOSIS — F332 Major depressive disorder, recurrent severe without psychotic features: Principal | ICD-10-CM | POA: Diagnosis present

## 2023-02-22 LAB — RAPID URINE DRUG SCREEN, HOSP PERFORMED
Amphetamines: NOT DETECTED
Barbiturates: NOT DETECTED
Benzodiazepines: NOT DETECTED
Cocaine: NOT DETECTED
Opiates: NOT DETECTED
Tetrahydrocannabinol: POSITIVE — AB

## 2023-02-22 MED ORDER — MIRTAZAPINE 15 MG PO TABS
15.0000 mg | ORAL_TABLET | Freq: Every day | ORAL | Status: DC
  Administered 2023-02-22 – 2023-02-25 (×4): 15 mg via ORAL
  Filled 2023-02-22 (×6): qty 1

## 2023-02-22 MED ORDER — WHITE PETROLATUM EX OINT
TOPICAL_OINTMENT | CUTANEOUS | Status: AC
  Filled 2023-02-22: qty 5

## 2023-02-22 MED ORDER — VENLAFAXINE HCL ER 75 MG PO CP24
75.0000 mg | ORAL_CAPSULE | Freq: Every day | ORAL | Status: DC
  Administered 2023-02-23 – 2023-02-25 (×3): 75 mg via ORAL
  Filled 2023-02-22 (×5): qty 1

## 2023-02-22 NOTE — Group Note (Signed)
Recreation Therapy Group Note   Group Topic:Problem Solving  Group Date: 02/22/2023 Start Time: 1005 End Time: 1035 Facilitators: Hamlin Devine-McCall, LRT,CTRS Location: [address] Hall Dayroom   Goal Area(s) Addresses:  Patient will effectively work in a team with other group members. Patient will verbalize importance of using appropriate problem solving techniques.  Patient will identify positive change associated with effective problem solving skills.   Group Description: Brain Teasers.  Patients were given two sheets of brain teasers. Patients were to complete the teasers to the best of their ability. Once completed, LRT and patients reviewed the answers. Patients would add all the questions they got correct, the person with the most correct answers got a prize. If there was a tie, LRT would ask a tie breaker question.    Affect/Mood: N/A   Participation Level: Did not attend    Clinical Observations/Individualized Feedback:    Plan: Continue to engage patient in RT group sessions 2-3x/week.   Sean Calderon, LRT,CTRS 02/22/2023 1:37 PM

## 2023-02-22 NOTE — BHH Suicide Risk Assessment (Signed)
[facility] Admission Suicide Risk Assessment   Nursing information obtained from:  Patient Demographic factors:  Male, Adolescent or young adult Current Mental Status:  Suicidal ideation indicated by patient Loss Factors:  NA Historical Factors:  NA Risk Reduction Factors:  Sense of responsibility to family  Total Time spent with patient: 30 minutes Principal Problem: MDD (major depressive disorder), recurrent severe, without psychosis (HCC) Diagnosis:  Principal Problem:   MDD (major depressive disorder), recurrent severe, without psychosis (HCC) Active Problems:   Generalized anxiety disorder  Subjective Data: See H&P   Continued Clinical Symptoms:  Alcohol Use Disorder Identification Test Final Score (AUDIT): 0 The "Alcohol Use Disorders Identification Test", Guidelines for Use in Primary Care, Second Edition.  World Science writer [facility]). Score between 0-7:  no or low risk or alcohol related problems. Score between 8-15:  moderate risk of alcohol related problems. Score between 16-19:  high risk of alcohol related problems. Score 20 or above:  warrants further diagnostic evaluation for alcohol dependence and treatment.   CLINICAL FACTORS:   Severe Anxiety and/or Agitation Depression:   Anhedonia Hopelessness Insomnia Severe Alcohol/Substance Abuse/Dependencies More than one psychiatric diagnosis Unstable or Poor Therapeutic Relationship Previous Psychiatric Diagnoses and Treatments    Psychiatric Specialty Exam:  Presentation  General Appearance:  Casual  Eye Contact: Fair  Speech: Normal Rate  Speech Volume: Normal  Handedness: Right   Mood and Affect  Mood: Depressed; Irritable  Affect: Congruent; Constricted; Tearful   Thought Process  Thought Processes: Coherent; Linear  Descriptions of Associations:Intact  Orientation:Full (Time, Place and Person)  Thought Content:Logical; Rumination; Abstract Reasoning  History of  Schizophrenia/Schizoaffective disorder:No data recorded Duration of Psychotic Symptoms:No data recorded Hallucinations:Hallucinations: None  Ideas of Reference:None  Suicidal Thoughts:Suicidal Thoughts: No SI Active Intent and/or Plan: Without Plan  Homicidal Thoughts:Homicidal Thoughts: No   Sensorium  Memory: Immediate Fair; Remote Good; Recent Good  Judgment: Fair  Insight: Fair   Chartered certified accountant: Fair  Attention Span: Fair  Recall: Fair  Fund of Knowledge: Good  Language: Good   Psychomotor Activity  Psychomotor Activity: Psychomotor Activity: Decreased; Restlessness   Assets  Assets: Social Support; Manufacturing systems engineer; Desire for Improvement; Resilience   Sleep  Sleep: Sleep: Poor Number of Hours of Sleep: 3    Physical Exam: Physical Exam See H&P  ROS See H&P  Blood pressure 113/78, pulse 93, temperature 97.7 F (36.5 C), temperature source Oral, resp. rate (!) 21, height 5\' 1"  (1.549 m), weight 47.8 kg, SpO2 100 %. Body mass index is 19.92 kg/m.   COGNITIVE FEATURES THAT CONTRIBUTE TO RISK:  None    SUICIDE RISK:   Moderate:  Frequent suicidal ideation with limited intensity, and duration, some specificity in terms of plans, no associated intent, good self-control, limited dysphoria/symptomatology, some risk factors present, and identifiable protective factors, including available and accessible social support.  PLAN OF CARE: See H&P   I certify that inpatient services furnished can reasonably be expected to improve the patient's condition.   Cristy Hilts, MD 02/22/2023, 3:53 PM

## 2023-02-22 NOTE — Progress Notes (Signed)
   02/22/23 1400  Psych Admission Type (Psych Patients Only)  Admission Status Voluntary  Psychosocial Assessment  Patient Complaints Anxiety;Depression  Eye Contact Fair  Facial Expression Sad;Anxious  Affect Anxious;Depressed  Speech Logical/coherent;Soft  Interaction Guarded  Motor Activity Other (Comment) (WNL)  Appearance/Hygiene Unremarkable  Behavior Characteristics Cooperative;Appropriate to situation  Mood Depressed;Anxious  Thought Process  Coherency WDL  Content WDL  Delusions None reported or observed  Perception WDL  Hallucination None reported or observed  Judgment Poor  Confusion None  Danger to Self  Current suicidal ideation? Denies  Self-Injurious Behavior No self-injurious ideation or behavior indicators observed or expressed   Agreement Not to Harm Self Yes  Description of Agreement verbal  Danger to Others  Danger to Others None reported or observed

## 2023-02-22 NOTE — BHH Group Notes (Signed)
Spiritual care group facilitated by Chaplain Katy Gerritt Galentine, BCC  Group focused on topic of strength. Group members reflected on what thoughts and feelings emerge when they hear this topic. They then engaged in facilitated dialog around how strength is present in their lives. This dialog focused on representing what strength had been to them in their lives (images and patterns given) and what they saw as helpful in their life now (what they needed / wanted).  Activity drew on narrative framework.  Patient Progress: Did not attend.  

## 2023-02-22 NOTE — H&P (Signed)
Psychiatric Admission Assessment Adult  Patient Identification: Sean Calderon MRN:  [id number] Date of Evaluation:  02/22/2023 Chief Complaint:  Generalized anxiety disorder [F41.1] Principal Diagnosis: MDD (major depressive disorder), recurrent severe, without psychosis (HCC) Diagnosis:  Principal Problem:   MDD (major depressive disorder), recurrent severe, without psychosis (HCC) Active Problems:   Generalized anxiety disorder  History of Present Illness:  CC: Insomnia and Anxiety  Sean Calderon is a 22 y.o. male with past psychiatric history of depression, anxiety, cannabis abuse and psychosis requiring hospitalization who was admitted to the [facility] voluntarily from the ED for evaluation of suicidal ideation in the setting of worsening depression and  insomnia.    Mode of transport to Hospital: Voluntary admission via [facility] ED Current Outpatient (Home) Medication List: None PRN medication prior to evaluation: None        HPI:  Pt reports psychiatric decompensation for about 1 month with worsening depression and insomnia. Patient reports history of insomnia that worsened during the last 7-8 days and sleeping at most 3 hours per night. He reports difficulty falling asleep, staying asleep through the night. He reports 2 recent episodes of jolting awake and feeling sense of impending doom which scared him. Reports difficulty with initiating and maintaining sleep due to anxiety and depression, and also reports back pain causing poor sleep (sleeping on sofa at home). Patient was noted to be tearful during interview. Patient reports living with his mother and 2 brothers and sleeping on the couch. He reports becoming overwhelmed and frustrated with inability to sleep and told his older brother, "I want this to be over." and the brother drove him to the ED. Patient reports tearfully, "I just wanted relief from this stuff going on and on." He reports low mood and feelings of  hopelessness. He reports low energy and difficult concentrating and "brain fog". Patient reports loss of interest in activities he usually enjoys. His appetite is intact. At this time, the patient denies any suicidal ideations, homicidal ideations, plans, means, or intent. Reports anxiety level is elevated, generalized, chronic, excessive impairing concentration and sleep. Denies any auditory hallucinations, visual hallucinations, or paranoia. He denies any delusional thought processes including thought insertion, thought withdrawal, or ideas of reference. Review of symptoms is negative for symptoms of mania.       Patient's mother, Neldon Mc, was contacted for collateral call at 11:45AM on 02/22/2023, at this number [phone]. Patient's current diagnosis was explained as evidenced by specific signs/symptoms.  Assisted his mother with understanding implications for patient treatment based on diagnosis and current level of behaviors/symptoms. She reports patient patient depressed for months since starting to use cannabis vap pen. He also has daily back pain and he is unable to sit for very long. Patient had a seizure in April of 2023 and patient stopped Wellbutrin and has been in fear of taking medication since. Started using cannibas vaps for pain relief. She reports he gets angry quick, he temper is short. She states, "I look at him and he seems different... walking around all night...  I threw 2 of them (vape pens) in the garbage and he bought 2 more."  On 02/21/2023 patient became emotionally overwhelmed at home and called his younger brother who disposed of his vape pens and took him to the ED.    Patient applied for disability, was denied and reapplied. Ms. Robley Fries is on board with safety plan and says patient can stay with her at home when he leaves [facility].Ms. Robley Fries asked pertinent questions  pertaining to understanding diagnosis and implications for treatment.   Past Psychiatric Hx: Previous Psych  Diagnoses: Anxiety, Depression, ADHD, Psychosis Prior inpatient treatment: prior psychiatric hospitalization twice in childhood Current/prior outpatient treatment: Talk therapy at [facility] student health services Prior rehab hx: Not applicable Psychotherapy hx:  History of suicide: Denies History of homicide or aggression: Not applicable Psychiatric medication history: Wellbutrin for depression, however in 2020 Wellbutrin dc due to seizures. Rx'd Prozac, filled prescription but never started Psychiatric medication compliance history: Noncompliant Neuromodulation history: Not applicable Current Psychiatrist: None Current therapist: Garry Heater, LCSW at [facility]     Substance Abuse Hx: Alcohol: Denies Tobacco: Denies Illicit drugs: Cannabis abuse: vaping 3 times per day Rx drug abuse: Denies Rehab hx: Denies     Past Medical History: Medical Diagnoses: Compression fracture of vertebrae Home Rx: None Prior Hosp: See HPI Prior Surgeries/Trauma: Wisdom tooth extraction (age 58 y.o.) Head trauma, LOC, concussions, seizures:  Allergies: Denies LMP: Not applicable Contraception: Not applicable PCP: Susanne Greenhouse, MD ([facility])     Family History: Medical: Denies Psych: Denies Psych Rx: Denies SA/HA: Denies Substance use family hx: Denies     Social History: Childhood (bring, raised, lives now, parents, siblings, schooling, education): Raised by mother with 2 brothers. Pt is middle sibling. 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 Abuse: Denies Marital Status: Single Sexual orientation: Gay Children: Not applicable Employment: None Peer Group: Automotive engineer friends, 2 brothers Housing: Living with mother/family for the summer Finances: No concerns Legal: No concerns Military: Not applicable     Total Time spent with patient: 45 minutes    Is the patient at risk to self? Yes.    Has the patient been a risk to self in the past 6 months?  No.  Has the patient been a risk to self within the distant past? No.  Is the patient a risk to others? No.  Has the patient been a risk to others in the past 6 months? No.  Has the patient been a risk to others within the distant past? No.   [place] Scale:  Flowsheet Row [facility] Most recent reading at 02/21/2023  4:00 PM ED from 02/21/2023 in [facility] Emergency Department at [facility] Most recent reading at 02/21/2023  5:42 AM ED to Hosp-Admission (Discharged) from 12/17/2021 in [place] [facility] Most recent reading at 12/17/2021 10:41 AM  C-SSRS RISK CATEGORY Low Risk Low Risk Low Risk          Alcohol Screening: 1. How often do you have a drink containing alcohol?: Never 2. How many drinks containing alcohol do you have on a typical day when you are drinking?: 1 or 2 3. How often do you have six or more drinks on one occasion?: Never AUDIT-C Score: 0 4. How often during the last year have you found that you were not able to stop drinking once you had started?: Never 5. How often during the last year have you failed to do what was normally expected from you because of drinking?: Never 6. How often during the last year have you needed a first drink in the morning to get yourself going after a heavy drinking session?: Never 7. How often during the last year have you had a feeling of guilt of remorse after drinking?: Never 8. How often during the last year have you been unable to remember what happened the night before because you  had been drinking?: Never 9. Have you or someone else been injured as a result of your drinking?: No 10. Has a relative or friend or a doctor or another health worker been concerned about your drinking or suggested you cut down?: No Alcohol Use Disorder Identification Test Final Score (AUDIT): 0 Substance Abuse History in the last 12 months:  Yes.   Consequences  of Substance Abuse: Negative Previous Psychotropic Medications: Yes  Psychological Evaluations: Yes  Past Medical History:  Past Medical History:  Diagnosis Date   ADHD (attention deficit hyperactivity disorder)    Mental disorder    Psychosis (HCC)     Past Surgical History:  Procedure Laterality Date   WISDOM TOOTH EXTRACTION     Family History:  Family History  Problem Relation Age of Onset   Migraines Brother    Seizures Neg Hx    Depression Neg Hx    Anxiety disorder Neg Hx    ADD / ADHD Neg Hx    Autism Neg Hx     Tobacco Screening:  Social History   Tobacco Use  Smoking Status Never  Smokeless Tobacco Never    BH Tobacco Counseling     Are you interested in Tobacco Cessation Medications?  No value filed. Counseled patient on smoking cessation:  No value filed. Reason Tobacco Screening Not Completed: No value filed.       Social History:  Social History   Substance and Sexual Activity  Alcohol Use No     Social History   Substance and Sexual Activity  Drug Use Yes   Types: Marijuana   Comment: Previously, using once weekly. None since hospital on 12/17/21.    Additional Social History: Marital status: Single Are you sexually active?: No What is your sexual orientation?: " straight " Has your sexual activity been affected by drugs, alcohol, medication, or emotional stress?: None reported Does patient have children?: No                         Allergies:   Allergies  Allergen Reactions   Bupropion Other (See Comments)    seizure   Lab Results:  Results for orders placed or performed during the hospital encounter of 02/21/23 (from the past 48 hour(s))  Rapid urine drug screen (hospital performed)     Status: Abnormal   Collection Time: 02/21/23  3:05 PM  Result Value Ref Range   Opiates NONE DETECTED NONE DETECTED   Cocaine NONE DETECTED NONE DETECTED   Benzodiazepines NONE DETECTED NONE DETECTED   Amphetamines NONE DETECTED  NONE DETECTED   Tetrahydrocannabinol POSITIVE (A) NONE DETECTED   Barbiturates NONE DETECTED NONE DETECTED    Comment: (NOTE) DRUG SCREEN FOR MEDICAL PURPOSES ONLY.  IF CONFIRMATION IS NEEDED FOR ANY PURPOSE, NOTIFY LAB WITHIN 5 DAYS.  LOWEST DETECTABLE LIMITS FOR URINE DRUG SCREEN Drug Class                     Cutoff (ng/mL) Amphetamine and metabolites    1000 Barbiturate and metabolites    200 Benzodiazepine                 200 Opiates and metabolites        300 Cocaine and metabolites        300 THC                            50 Performed  at [facility], [address], [place], [place] [address]     Blood Alcohol level:  Lab Results  Component Value Date   [facility] <10 02/21/2023   ETH <11 01/24/2013    Metabolic Disorder Labs:  No results found for: "HGBA1C", "MPG" No results found for: "PROLACTIN" No results found for: "CHOL", "TRIG", "HDL", "CHOLHDL", "VLDL", "LDLCALC"  Current Medications: Current Facility-Administered Medications  Medication Dose Route Frequency Provider Last Rate Last Admin   acetaminophen (TYLENOL) tablet 650 mg  650 mg Oral Q6H PRN Onuoha, Josephine C, NP       alum & mag hydroxide-simeth (MAALOX/MYLANTA) 200-200-20 MG/5ML suspension 30 mL  30 mL Oral Q4H PRN Welford Roche, Josephine C, NP       calcium-vitamin D (OSCAL WITH D) 500-5 MG-MCG per tablet 1 tablet  1 tablet Oral Q breakfast Dahlia Byes C, NP   1 tablet at 02/22/23 0859   diphenhydrAMINE (BENADRYL) capsule 50 mg  50 mg Oral TID PRN Earney Navy, NP       Or   diphenhydrAMINE (BENADRYL) injection 50 mg  50 mg Intramuscular TID PRN Dahlia Byes C, NP       haloperidol (HALDOL) tablet 5 mg  5 mg Oral TID PRN Dahlia Byes C, NP   5 mg at 02/22/23 6045   Or   haloperidol lactate (HALDOL) injection 5 mg  5 mg Intramuscular TID PRN Earney Navy, NP       hydrOXYzine (ATARAX) tablet 25 mg  25 mg Oral TID PRN Dahlia Byes C, NP   25 mg at  02/22/23 1234   LORazepam (ATIVAN) tablet 2 mg  2 mg Oral TID PRN Dahlia Byes C, NP   2 mg at 02/22/23 4098   Or   LORazepam (ATIVAN) injection 2 mg  2 mg Intramuscular TID PRN Dahlia Byes C, NP       magnesium hydroxide (MILK OF MAGNESIA) suspension 30 mL  30 mL Oral Daily PRN Dahlia Byes C, NP       melatonin tablet 5 mg  5 mg Oral QHS Dahlia Byes C, NP   5 mg at 02/21/23 2107   mirtazapine (REMERON) tablet 15 mg  15 mg Oral QHS Demoni Gergen, Harrold Donath, MD       traZODone (DESYREL) tablet 50 mg  50 mg Oral QHS PRN Bobbitt, Shalon E, NP   50 mg at 02/21/23 2308   [START ON 02/23/2023] venlafaxine XR (EFFEXOR-XR) 24 hr capsule 75 mg  75 mg Oral Q breakfast Daijha Leggio, Harrold Donath, MD       vitamin D3 (CHOLECALCIFEROL) tablet 1,000 Units  1,000 Units Oral BID Dahlia Byes C, NP   1,000 Units at 02/22/23 0859   PTA Medications: Medications Prior to Admission  Medication Sig Dispense Refill Last Dose   acetaminophen (TYLENOL) 500 MG tablet Take 1 tablet (500 mg total) by mouth every 4 (four) hours as needed for mild pain or moderate pain. (Patient not taking: Reported on 02/21/2023) 30 tablet 0    calcium-vitamin D (OSCAL WITH D) 500-5 MG-MCG tablet Take 1 tablet by mouth daily with breakfast.      cholecalciferol (VITAMIN D3) 25 MCG (1000 UNIT) tablet Take 1,000 Units by mouth 2 (two) times daily.      diazePAM, 15 MG Dose, (VALTOCO 15 MG DOSE) 2 x 7.5 MG/0.1ML LQPK Place 7.5 mg in each nostril for a seizure if lasting more than 5 minutes (Patient taking differently: Place 7.5 mg into the nose See admin instructions.  Place 7.5 mg in each nostril for a seizure if lasting more than 5 minutes) 2 each 2    diclofenac (VOLTAREN) 75 MG EC tablet Take 75 mg by mouth 2 (two) times daily.      FLUoxetine (PROZAC) 20 MG capsule Take 1 capsule (20 mg total) by mouth daily. (Patient not taking: Reported on 02/21/2023) 30 capsule 2    hydrOXYzine (ATARAX) 25 MG tablet Take 50 mg by mouth See admin  instructions. Take 2 Tablets by mouth nightly at bedtime as needed for sleep or anxiety (May take once during day if needed to help with anxiety) for up to 30 days      levETIRAcetam (KEPPRA) 500 MG tablet Take 1 tablet (500 mg total) by mouth 2 (two) times daily for 7 days. (Patient not taking: Reported on 02/21/2023) 14 tablet 0     Musculoskeletal: Strength & Muscle Tone: within normal limits Gait & Station: normal Patient leans: N/A            Psychiatric Specialty Exam:  Presentation  General Appearance:  Casual  Eye Contact: Fair  Speech: Normal Rate  Speech Volume: Normal  Handedness: Right   Mood and Affect  Mood: Depressed; Irritable  Affect: Congruent; Constricted; Tearful   Thought Process  Thought Processes: Coherent; Linear  Duration of Psychotic Symptoms: months Past Diagnosis of Schizophrenia or Psychoactive disorder: No data recorded Descriptions of Associations:Intact  Orientation:Full (Time, Place and Person)  Thought Content:Logical; Rumination; Abstract Reasoning  Hallucinations:Hallucinations: None  Ideas of Reference:None  Suicidal Thoughts:Suicidal Thoughts: No SI Active Intent and/or Plan: Without Plan  Homicidal Thoughts:Homicidal Thoughts: No   Sensorium  Memory: Immediate Fair; Remote Good; Recent Good  Judgment: Fair  Insight: Fair   Chartered certified accountant: Fair  Attention Span: Fair  Recall: Fair  Fund of Knowledge: Good  Language: Good   Psychomotor Activity  Psychomotor Activity: Psychomotor Activity: Decreased; Restlessness   Assets  Assets: Social Support; Manufacturing systems engineer; Desire for Improvement; Resilience   Sleep  Sleep: Sleep: Poor Number of Hours of Sleep: 3    Physical Exam: Physical Exam Vitals reviewed.  Constitutional:      General: He is not in acute distress.    Appearance: He is normal weight. He is not toxic-appearing.  Pulmonary:      Effort: Pulmonary effort is normal. No respiratory distress.  Neurological:     Mental Status: He is alert.     Motor: No weakness.     Gait: Gait normal.  Psychiatric:        Behavior: Behavior normal.        Thought Content: Thought content normal.        Judgment: Judgment normal.    Review of Systems  Constitutional:  Negative for chills and fever.  Cardiovascular:  Negative for chest pain and palpitations.  Neurological:  Negative for dizziness, tingling, tremors and headaches.  Psychiatric/Behavioral:  Positive for depression, substance abuse and suicidal ideas. Negative for hallucinations and memory loss. The patient is nervous/anxious and has insomnia.   All other systems reviewed and are negative.  Blood pressure 113/78, pulse 93, temperature 97.7 F (36.5 C), temperature source Oral, resp. rate (!) 21, height 5\' 1"  (1.549 m), weight 47.8 kg, SpO2 100 %. Body mass index is 19.92 kg/m.  Treatment Plan Summary: Daily contact with patient to assess and evaluate symptoms and progress in treatment and Medication management  ASSESSMENT:  Diagnoses / Active Problems: MDD GAD  PLAN: Safety and Monitoring:             -  Voluntary admission to inpatient psychiatric unit for safety, stabilization and treatment             - Daily contact with patient to assess and evaluate symptoms and progress in treatment             - Patient's case to be discussed in multi-disciplinary team meeting             - Observation Level: q15 minute checks             - Vital signs: q12 hours             - Precautions: suicide, elopement, and assault   2. Psychiatric Diagnoses and Treatment:   Major Depressive Disorder  Generalized Anxiety Disorder  Insomnia - stop Lexapro 5 mg PO daily (started by admission provider on 7/2) - start today: Remeron 15 mg PO at bedtime for insomnia, depression, poor appetite - start tomorrow: Effexor-XR 75 mg PO daily for depression and anxiety  - The  risks/benefits/side-effects/alternatives to these medications were discussed in detail with the patient and time was given for questions. The patient consents to medication trial.                         - Encouraged patient to participate in unit milieu and in scheduled group therapies.              - Short Term Goals: Ability to identify changes in lifestyle to reduce recurrence of condition will improve. Ability to identify and develop effective coping behaviors will improve. Ability to maintain clinical measurements within normal limits will improve. Compliance with prescribed medications will improve and ability to identify triggers associated with substance abuse and insomnia issues will improve.             - Long Term Goals: Improvement in symptoms so as ready for safe discharge              3. Medical Issues Being Addressed:              -- not applicable    4. Discharge Planning:              -- Social work and case management to assist with discharge planning and identification of hospital follow-up needs prior to discharge             -- Estimated LOS: 5-7 days              -- Discharge Concerns: Need to establish a safety plan; Medication compliance and effectiveness             -- Discharge Goals: Return home with outpatient referrals for mental health follow-up including medication management/psychotherapy    I certify that inpatient services furnished can reasonably be expected to improve the patient's condition.    Cristy Hilts, MD 7/3/20243:55 PM  Total Time Spent in Direct Patient Care:  I personally spent 60 minutes on the unit in direct patient care. The direct patient care time included face-to-face time with the patient, reviewing the patient's chart, communicating with other professionals, and coordinating care. Greater than 50% of this time was spent in counseling or coordinating care with the patient regarding goals of hospitalization, psycho-education, and  discharge planning needs.   Phineas Inches, MD Psychiatrist

## 2023-02-22 NOTE — Group Note (Signed)
Date:  02/22/2023 Time:  3:47 PM  Group Topic/Focus:  Orientation:   The focus of this group is to educate the patient on the purpose and policies of crisis stabilization and provide a format to answer questions about their admission.  The group details unit policies and expectations of patients while admitted.    Participation Level:  Did Not Attend  Participation Quality:    Affect:      Cognitive:      Insight: None  Engagement in Group:    Modes of Intervention:      Additional Comments:     Reymundo Poll 02/22/2023, 3:47 PM

## 2023-02-22 NOTE — Progress Notes (Signed)
   02/21/23 2000  Psych Admission Type (Psych Patients Only)  Admission Status Voluntary  Psychosocial Assessment  Patient Complaints Anxiety;Depression;Insomnia;Crying spells  Eye Contact Fair  Facial Expression Sad;Flat;Anxious  Affect Anxious;Depressed;Preoccupied;Sad;Flat  Speech Logical/coherent  Interaction Guarded;Hypervigilant;Isolative;Needy  Motor Activity Fidgety;Pacing;Slow  Appearance/Hygiene Unremarkable  Behavior Characteristics Cooperative;Appropriate to situation  Mood Depressed;Anxious;Suspicious;Sad;Preoccupied  Thought Process  Coherency WDL  Content Preoccupation  Delusions None reported or observed  Perception WDL  Hallucination None reported or observed  Judgment Poor  Confusion None  Danger to Self  Current suicidal ideation? Denies  Self-Injurious Behavior No self-injurious ideation or behavior indicators observed or expressed   Agreement Not to Harm Self Yes  Description of Agreement Verbally contracted for safety,  Danger to Others  Danger to Others None reported or observed

## 2023-02-22 NOTE — Progress Notes (Signed)
The administered medication were effective in reducing the patient's agitation, anxiety and restlessness, patient now appears relaxed and is currently asleep and resting comfortably in the quiet room.

## 2023-02-22 NOTE — Progress Notes (Signed)
Patient reports that he is still  experiencing significant insomnia, and anxiety  leading to difficulty sleeping. Patient also continues to express fear related to the sheared room environment, and  room mates's behaviors including loud snoring  is contributing to his increased distress and inability to relax. Patient appears visibly  anxious, agitated ,and restless, noted crying spells, and an inability to remain still in bed, patient also continually looks towards the roommate side of the room. Patient inquired about the availability of a quiet room stating " is there a room I can go to? I need  to feel safe, I need peace and quiet"  Patient also requested for more sleep medication and something else to help calm down so he can go to sleep. Informed the attending provider for further evaluation and  the patient's request for medication and a quiet room.  The NP gave order to administer only two of the agitation protocol medication excluding benadryl since patient states" I can't take benadryl because it  causes seizures for me"  patient was also temporarily transferred  to the calm room to promote relaxation and sleep. Q 15 minutes Checks in progress, will continue to monitor closely for any changes in behavior or condition.

## 2023-02-22 NOTE — Group Note (Signed)
Date:  02/22/2023 Time:  5:01 PM  Group Topic/Focus:  Wellness Toolbox:   The focus of this group is to discuss various aspects of wellness, balancing those aspects and exploring ways to increase the ability to experience wellness.  Patients will create a wellness toolbox for use upon discharge.    Participation Level:  Active  Participation Quality:  Appropriate  Affect:  Appropriate  Cognitive:  Appropriate  Insight: Appropriate  Engagement in Group:  Engaged  Modes of Intervention:  Education  Additional Comments:     Sean Calderon 02/22/2023, 5:01 PM

## 2023-02-22 NOTE — Progress Notes (Signed)
At start of shift patient  is alert and oriented to person, place time and situation. Patient is isolative to room,  denies pain at this time, patient appears overwhelmed, anxious and fearful, expresses discomfort with current rooming situation, continually glances at the roommate and appears restless. Denies SI/HI/AVH  and contracted for safety. Writer Provided support and reassurance to patient  on safety, while assessing the possibility of room change or alternative  arrangement. Regular night time medications administered including hydroxyzine 25 mg po. Q 15 Minutes checks in progress.

## 2023-02-22 NOTE — Plan of Care (Signed)
  Problem: Activity: Goal: Imbalance in normal sleep/wake cycle will improve Outcome: Progressing   Problem: Coping: Goal: Coping ability will improve Outcome: Progressing Goal: Will verbalize feelings Outcome: Progressing   Problem: Health Behavior/Discharge Planning: Goal: Compliance with therapeutic regimen will improve Outcome: Progressing   Problem: Safety: Goal: Ability to disclose and discuss suicidal ideas will improve Outcome: Progressing   Problem: Self-Concept: Goal: Will verbalize positive feelings about self Outcome: Progressing Goal: Level of anxiety will decrease Outcome: Progressing   Problem: Education: Goal: Ability to make informed decisions regarding treatment will improve Outcome: Progressing
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
## 2023-02-22 NOTE — [facility] Group Notes (Signed)
[facility] Group Notes:  (Nursing/MHT/Case Management/Adjunct)  Date:  02/22/2023  Time:  8:07 PM  Type of Therapy:   NA group  Participation Level:  Did Not Attend  Participation Quality:    Affect:    Cognitive:    Insight:    Engagement in Group:    Modes of Intervention:    Summary of Progress/Problems: didn't attend.  Sean Calderon 02/22/2023, 8:07 PM

## 2023-02-23 MED ORDER — CLONAZEPAM 0.5 MG PO TABS
1.0000 mg | ORAL_TABLET | ORAL | Status: AC
  Administered 2023-02-23: 1 mg via ORAL
  Filled 2023-02-23: qty 2

## 2023-02-23 NOTE — Progress Notes (Signed)
   02/23/23 1100  Psych Admission Type (Psych Patients Only)  Admission Status Voluntary  Psychosocial Assessment  Patient Complaints Anxiety;Depression  Eye Contact Fair  Facial Expression Sad;Anxious  Affect Depressed  Speech Logical/coherent;Soft  Interaction Guarded  Motor Activity Other (Comment) (WNL)  Appearance/Hygiene Unremarkable  Behavior Characteristics Cooperative  Mood Depressed;Anxious  Thought Process  Coherency WDL  Content WDL  Delusions None reported or observed  Perception WDL  Hallucination None reported or observed  Judgment Limited  Confusion None  Danger to Self  Current suicidal ideation? Denies  Self-Injurious Behavior No self-injurious ideation or behavior indicators observed or expressed   Agreement Not to Harm Self Yes  Description of Agreement verbal  Danger to Others  Danger to Others None reported or observed

## 2023-02-23 NOTE — [facility] Group Notes (Signed)
[facility] Group Notes:  (Nursing/MHT/Case Management/Adjunct)  Date:  02/23/2023  Time:  9:29 PM  Type of Therapy:   Wrap-up group  Participation Level:  Active  Participation Quality:  Appropriate  Affect:  Appropriate  Cognitive:  Appropriate  Insight:  Appropriate  Engagement in Group:  Engaged  Modes of Intervention:  Education  Summary of Progress/Problems: Pt goal to sleep, pt reports he has not been sleeping. Day 6/10.   Noah Delaine 02/23/2023, 9:29 PM

## 2023-02-23 NOTE — Group Note (Signed)
Date:  02/23/2023 Time:  12:16 PM  Group Topic/Focus:     SOCIAL WORK GROUP                                                   [facility] LCSW Group Therapy Note    Group Date: @GROUPDATE @ Start Time: @GROUPSTARTTIME @ End Time: @GROUPENDTIME @  Type of Therapy and Topic:  Group Therapy:  Overcoming Obstacles  Participation Level:    Mood:  Description of Group:   In this group patients will be encouraged to explore what they see as obstacles to their own wellness and recovery. They will be guided to discuss their thoughts, feelings, and behaviors related to these obstacles. The group will process together ways to cope with barriers, with attention given to specific choices patients can make. Each patient will be challenged to identify changes they are motivated to make in order to overcome their obstacles. This group will be process-oriented, with patients participating in exploration of their own experiences as well as giving and receiving support and challenge from other group members.  Therapeutic Goals: 1. Patient will identify personal and current obstacles as they relate to admission. 2. Patient will identify barriers that currently interfere with their wellness or overcoming obstacles.  3. Patient will identify feelings, thought process and behaviors related to these barriers. 4. Patient will identify two changes they are willing to make to overcome these obstacles:    Summary of Patient Progress     Therapeutic Modalities:   Cognitive Behavioral Therapy Solution Focused Therapy Motivational Interviewing Relapse Prevention Therapy   Sean Calderon Sean Calderon   Participation Level:  Active  Participation Quality:  Appropriate  Affect:  Appropriate  Cognitive:  Appropriate  Insight: Appropriate  Engagement in Group:  Engaged  Modes of Intervention:  Discussion and Exploration  Additional Comments:    Sean Calderon Sean Calderon 02/23/2023, 12:16 PM

## 2023-02-23 NOTE — Progress Notes (Signed)
   02/23/23 2045  Psych Admission Type (Psych Patients Only)  Admission Status Voluntary  Psychosocial Assessment  Patient Complaints Insomnia  Eye Contact Fair  Facial Expression Sad  Affect Depressed  Speech Logical/coherent  Interaction Cautious  Motor Activity Other (Comment) (wdl)  Appearance/Hygiene Unremarkable  Behavior Characteristics Cooperative  Mood Depressed;Sad  Thought Process  Coherency WDL  Content WDL  Delusions None reported or observed  Perception WDL  Hallucination None reported or observed  Judgment Poor  Confusion Mild  Danger to Self  Current suicidal ideation? Denies (denies)  Self-Injurious Behavior No self-injurious ideation or behavior indicators observed or expressed   Agreement Not to Harm Self Yes  Description of Agreement verbal  Danger to Others  Danger to Others None reported or observed   D: Pt alert and oriented. Pt rates depression 5/10 and anxiety 5/10.  Pt denies experiencing any SI/HI, or AVH at this time.   A: Scheduled medications administered to pt, per MD orders. Support and encouragement provided. Frequent verbal contact made. Routine safety checks conducted q15 minutes.   R: No adverse drug reactions noted. Pt verbally contracts for safety at this time. Pt complaint with medications and treatment plan. Pt isolates himself in the room. Pt remains safe at this time. Will continue to monitor.

## 2023-02-23 NOTE — Progress Notes (Signed)
   02/23/23 0200  Psych Admission Type (Psych Patients Only)  Admission Status Voluntary  Psychosocial Assessment  Patient Complaints Sadness  Eye Contact Fair  Facial Expression Sad  Affect Depressed  Speech Logical/coherent  Interaction Needy  Motor Activity Other (Comment) (wdl)  Appearance/Hygiene Unremarkable  Behavior Characteristics Cooperative;Appropriate to situation  Mood Depressed  Thought Process  Coherency WDL  Content WDL  Delusions None reported or observed  Perception WDL  Hallucination None reported or observed  Judgment Limited  Confusion None  Danger to Self  Current suicidal ideation? Denies (denies)  Self-Injurious Behavior No self-injurious ideation or behavior indicators observed or expressed   Agreement Not to Harm Self Yes  Description of Agreement verbal  Danger to Others  Danger to Others None reported or observed   D: Pt alert and oriented. Pt rates depression 6/10 and anxiety 6/10.  Pt denies experiencing any SI/HI, or AVH at this time.   A: Scheduled medications administered to pt, per MD orders. Support and encouragement provided. Frequent verbal contact made. Routine safety checks conducted q15 minutes.   R: No adverse drug reactions noted. Pt verbally contracts for safety at this time. Pt complaint with medications and treatment plan. Pt interacts well with others on the unit. Pt remains safe at this time. Will continue to monitor.

## 2023-02-23 NOTE — Progress Notes (Signed)
[facility] MD Progress Note  02/23/2023 12:57 PM Sean Calderon  MRN:  [id number]  Sean Calderon is a 22 y.o. male with past psychiatric history of depression, anxiety, cannabis abuse and psychosis requiring hospitalization who was admitted to the [facility] voluntarily from the ED for evaluation of suicidal ideation in the setting of worsening depression and  insomnia.   Subjective: Patient seen during a.m. rounds today.  Case discussed with RN, chart reviewed.  Patient reports he continues to feel depressed.  He reports that at times he feels helpless and hopeless.  He  reports that his sleep has been poor.Patient was provided with support.  Patient is not able to recognize any triggers for insomnia.  Patient did mention use of marijuana few days prior to admission.  He was encouraged to stay sober and avoid drug use.  Patient is anxious and is requesting something for anxiety.  Will give 1 mg of Klonopin to help with anxiety.  He denies any intention or plan to harm himself on the unit.  He denies psychotic symptoms.   Principal Problem: MDD (major depressive disorder), recurrent severe, without psychosis (HCC) Diagnosis: Principal Problem:   MDD (major depressive disorder), recurrent severe, without psychosis (HCC) Active Problems:   Generalized anxiety disorder  Total Time spent : 30 minutes  Past Psychiatric History: Previous Psych Diagnoses: Anxiety, Depression, ADHD, Psychosis Prior inpatient treatment: prior psychiatric hospitalization twice in childhood Current/prior outpatient treatment: Talk therapy at [facility] student health services Prior rehab hx: Not applicable Psychotherapy hx:  History of suicide: Denies History of homicide or aggression: Not applicable Psychiatric medication history: Wellbutrin for depression, however in 2020 Wellbutrin dc due to seizures. Rx'd Prozac, filled prescription but never started Psychiatric medication compliance history:  Noncompliant Neuromodulation history: Not applicable Current Psychiatrist: None Current therapist: Garry Heater, LCSW at [employer]  Past Medical History:  Past Medical History:  Diagnosis Date   ADHD (attention deficit hyperactivity disorder)    Mental disorder    Psychosis (HCC)     Past Surgical History:  Procedure Laterality Date   WISDOM TOOTH EXTRACTION     Family History:  Family History  Problem Relation Age of Onset   Migraines Brother    Seizures Neg Hx    Depression Neg Hx    Anxiety disorder Neg Hx    ADD / ADHD Neg Hx    Autism Neg Hx     Social History:  Social History   Substance and Sexual Activity  Alcohol Use No     Social History   Substance and Sexual Activity  Drug Use Yes   Types: Marijuana   Comment: Previously, using once weekly. None since hospital on 12/17/21.    Social History   Socioeconomic History   Marital status: Single    Spouse name: Not on file   Number of children: 0   Years of education: 15 years   Highest education level: Some college, no degree  Occupational History   Not on file  Tobacco Use   Smoking status: Never   Smokeless tobacco: Never  Vaping Use   Vaping Use: Every day   Substances: THC  Substance and Sexual Activity   Alcohol use: No   Drug use: Yes    Types: Marijuana    Comment: Previously, using once weekly. None since hospital on 12/17/21.   Sexual activity: Never  Other Topics Concern   Not on file  Social History Narrative   He lives with his mother and brother during  breaks from school. Lives in dorm during semesters.   Right-handed.   Archivist at [employer].   Social Determinants of Health   Financial Resource Strain: Not on file  Food Insecurity: No Food Insecurity (02/21/2023)   Hunger Vital Sign    Worried About Running Out of Food in the Last Year: Never true    Ran Out of Food in the Last Year: Never true  Transportation Needs: No Transportation Needs (02/21/2023)   PRAPARE -  Administrator, Civil Service (Medical): No    Lack of Transportation (Non-Medical): No  Physical Activity: Not on file  Stress: Not on file  Social Connections: Not on file   Additional Social History:                         Sleep: Poor  Appetite:  Fair  Current Medications: Current Facility-Administered Medications  Medication Dose Route Frequency Provider Last Rate Last Admin   acetaminophen (TYLENOL) tablet 650 mg  650 mg Oral Q6H PRN Onuoha, Josephine C, NP       alum & mag hydroxide-simeth (MAALOX/MYLANTA) 200-200-20 MG/5ML suspension 30 mL  30 mL Oral Q4H PRN Onuoha, Josephine C, NP       calcium-vitamin D (OSCAL WITH D) 500-5 MG-MCG per tablet 1 tablet  1 tablet Oral Q breakfast Dahlia Byes C, NP   1 tablet at 02/23/23 0820   clonazePAM (KLONOPIN) tablet 1 mg  1 mg Oral NOW Lewanda Rife, MD       diphenhydrAMINE (BENADRYL) capsule 50 mg  50 mg Oral TID PRN Dahlia Byes C, NP       Or   diphenhydrAMINE (BENADRYL) injection 50 mg  50 mg Intramuscular TID PRN Dahlia Byes C, NP       haloperidol (HALDOL) tablet 5 mg  5 mg Oral TID PRN Dahlia Byes C, NP   5 mg at 02/22/23 6045   Or   haloperidol lactate (HALDOL) injection 5 mg  5 mg Intramuscular TID PRN Dahlia Byes C, NP       hydrOXYzine (ATARAX) tablet 25 mg  25 mg Oral TID PRN Dahlia Byes C, NP   25 mg at 02/23/23 1012   LORazepam (ATIVAN) tablet 2 mg  2 mg Oral TID PRN Dahlia Byes C, NP   2 mg at 02/22/23 4098   Or   LORazepam (ATIVAN) injection 2 mg  2 mg Intramuscular TID PRN Dahlia Byes C, NP       magnesium hydroxide (MILK OF MAGNESIA) suspension 30 mL  30 mL Oral Daily PRN Dahlia Byes C, NP       melatonin tablet 5 mg  5 mg Oral QHS Onuoha, Josephine C, NP   5 mg at 02/22/23 2159   mirtazapine (REMERON) tablet 15 mg  15 mg Oral QHS Massengill, Harrold Donath, MD   15 mg at 02/22/23 2159   traZODone (DESYREL) tablet 50 mg  50 mg Oral QHS PRN Bobbitt,  Shalon E, NP   50 mg at 02/21/23 2308   venlafaxine XR (EFFEXOR-XR) 24 hr capsule 75 mg  75 mg Oral Q breakfast Massengill, Harrold Donath, MD   75 mg at 02/23/23 0820   vitamin D3 (CHOLECALCIFEROL) tablet 1,000 Units  1,000 Units Oral BID Dahlia Byes C, NP   1,000 Units at 02/23/23 1191    Lab Results:  Results for orders placed or performed during the hospital encounter of 02/21/23 (from the past 48 hour(s))  Rapid urine drug  screen (hospital performed)     Status: Abnormal   Collection Time: 02/21/23  3:05 PM  Result Value Ref Range   Opiates NONE DETECTED NONE DETECTED   Cocaine NONE DETECTED NONE DETECTED   Benzodiazepines NONE DETECTED NONE DETECTED   Amphetamines NONE DETECTED NONE DETECTED   Tetrahydrocannabinol POSITIVE (A) NONE DETECTED   Barbiturates NONE DETECTED NONE DETECTED    Comment: (NOTE) DRUG SCREEN FOR MEDICAL PURPOSES ONLY.  IF CONFIRMATION IS NEEDED FOR ANY PURPOSE, NOTIFY LAB WITHIN 5 DAYS.  LOWEST DETECTABLE LIMITS FOR URINE DRUG SCREEN Drug Class                     Cutoff (ng/mL) Amphetamine and metabolites    1000 Barbiturate and metabolites    200 Benzodiazepine                 200 Opiates and metabolites        300 Cocaine and metabolites        300 THC                            50 Performed at [facility], [address], [place], [place] [address]     Blood Alcohol level:  Lab Results  Component Value Date   [facility] <10 02/21/2023   ETH <11 01/24/2013    Metabolic Disorder Labs: No results found for: "HGBA1C", "MPG" No results found for: "PROLACTIN" No results found for: "CHOL", "TRIG", "HDL", "CHOLHDL", "VLDL", "LDLCALC"  Physical Findings: AIMS:  , ,  ,  ,    CIWA:    COWS:     Musculoskeletal: Strength & Muscle Tone: within normal limits Gait & Station: normal Patient leans: N/A  Psychiatric Specialty Exam:  Presentation  General Appearance:  Appropriate for Environment; Disheveled  Eye  Contact: Fair  Speech: Clear and Coherent; Normal Rate  Speech Volume: Normal  Handedness: Right   Mood and Affect  Mood: Anxious; Irritable Depressed Affect: Congruent; Labile   Thought Process  Thought Processes: Coherent  Descriptions of Associations:Intact  Orientation:Full (Time, Place and Person)  Thought Content:Perseveration  Hallucinations:Hallucinations: None  Ideas of Reference:None  Suicidal Thoughts:Suicidal Thoughts: No  Homicidal Thoughts:Homicidal Thoughts: No   Sensorium  Memory: Immediate Fair; Recent Fair  Judgment: Fair  Insight: Fair   Chartered certified accountant: Fair  Attention Span: Fair  Recall: [employer] of Knowledge: Fair  Language: Fair   Psychomotor Activity  Psychomotor Activity: Psychomotor Activity: Restlessness   Assets  Assets: Communication Skills; Desire for Improvement   Sleep  Sleep: Sleep: Poor Number of Hours of Sleep: 3    Physical Exam: Physical Exam Vitals reviewed.  Constitutional:      General: He is not in acute distress.    Appearance: He is normal weight. He is not toxic-appearing.  Pulmonary:     Effort: Pulmonary effort is normal. No respiratory distress.  Neurological:     Mental Status: He is alert.     Motor: No weakness.     Gait: Gait normal.  Psychiatric:        Behavior: Behavior normal.        Thought Content: Thought content normal.        Judgment: Judgment normal.      Review of Systems  Constitutional:  Negative for chills and fever.  Cardiovascular:  Negative for chest pain and palpitations.  Neurological:  Negative for dizziness, tingling, tremors  and headaches.  Psychiatric/Behavioral:  Positive for depression, substance abuse . Negative for hallucinations and memory loss. The patient is nervous/anxious and has insomnia.   All other systems reviewed and are negative.  Blood pressure 111/82, pulse 92, temperature (!) 97.4 F (36.3 C),  temperature source Oral, resp. rate (!) 21, height 5\' 1"  (1.549 m), weight 47.8 kg, SpO2 100 %. Body mass index is 19.92 kg/m.   Treatment Plan Summary: Daily contact with patient to assess and evaluate symptoms and progress in treatment and Medication management Diagnoses / Active Problems: MDD GAD   PLAN: Safety and Monitoring:             - Voluntary admission to inpatient psychiatric unit for safety, stabilization and treatment             - Daily contact with patient to assess and evaluate symptoms and progress in treatment             - Patient's case to be discussed in multi-disciplinary team meeting             - Observation Level: q15 minute checks             - Vital signs: q12 hours             - Precautions: suicide, elopement, and assault   2. Psychiatric Diagnoses and Treatment:    Major Depressive Disorder  Generalized Anxiety Disorder  Insomnia - stop Lexapro 5 mg PO daily (started by admission provider on 7/2) -Continue Remeron 15 mg PO at bedtime for insomnia, depression, poor appetite -Continue: Effexor-XR 75 mg PO daily for depression and anxiety   - The risks/benefits/side-effects/alternatives to these medications were discussed in detail with the patient and time was given for questions. The patient consents to medication trial.                         - Encouraged patient to participate in unit milieu and in scheduled group therapies.              - Short Term Goals: Ability to identify changes in lifestyle to reduce recurrence of condition will improve. Ability to identify and develop effective coping behaviors will improve. Ability to maintain clinical measurements within normal limits will improve. Compliance with prescribed medications will improve and ability to identify triggers associated with substance abuse and insomnia issues will improve.             - Long Term Goals: Improvement in symptoms so as ready for safe discharge              3. Medical  Issues Being Addressed:              -- not applicable    4. Discharge Planning:              -- Social work and case management to assist with discharge planning and identification of hospital follow-up needs prior to discharge             -- Estimated LOS: 5-7 days              -- Discharge Concerns: Need to establish a safety plan; Medication compliance and effectiveness             -- Discharge Goals: Return home with outpatient referrals for mental health follow-up including medication management/psychotherapy   Lewanda Rife, MD

## 2023-02-24 ENCOUNTER — Encounter (HOSPITAL_COMMUNITY): Payer: Self-pay | Admitting: Nurse Practitioner

## 2023-02-24 NOTE — Progress Notes (Signed)
   02/24/23 2139  Psych Admission Type (Psych Patients Only)  Admission Status Voluntary  Psychosocial Assessment  Patient Complaints None  Eye Contact Fair  Facial Expression Flat  Affect Flat  Speech Logical/coherent  Interaction Assertive  Motor Activity Other (Comment) (WDL)  Appearance/Hygiene Unremarkable  Behavior Characteristics Cooperative  Mood Pleasant  Thought Process  Coherency WDL  Content WDL  Delusions None reported or observed  Perception WDL  Hallucination None reported or observed  Judgment Limited  Confusion None  Danger to Self  Current suicidal ideation? Denies  Self-Injurious Behavior No self-injurious ideation or behavior indicators observed or expressed   Agreement Not to Harm Self Yes  Description of Agreement verbal  Danger to Others  Danger to Others None reported or observed

## 2023-02-24 NOTE — Group Note (Signed)
Date:  02/24/2023 Time:  3:39 PM  Group Topic/Focus:  Diagnosis Education:   The focus of this group is to discuss the major disorders that patients maybe diagnosed with.  Group discusses the importance of knowing what one's diagnosis is so that one can understand treatment and better advocate for oneself.    Participation Level:  Active  Participation Quality:  Appropriate  Affect:  Anxious  Cognitive:  Alert  Insight: Appropriate  Engagement in Group:  None  Modes of Intervention:  Education  Additional Comments:  Pt participated. In group  Harriet Masson 02/24/2023, 3:39 PM

## 2023-02-24 NOTE — Progress Notes (Signed)
[facility] MD Progress Note  02/24/2023 3:28 PM Sean Calderon  MRN:  [id number]  Sean Calderon is a 22 y.o. male with past psychiatric history of depression, anxiety, cannabis abuse and psychosis requiring hospitalization who was admitted to the [facility] voluntarily from the ED for evaluation of suicidal ideation in the setting of worsening depression and  insomnia.   Subjective: Patient reports his mood is good today and getting better. He reports 5 hours of sleep last night. Patient states he's making effort to be up and out of bed, not to take naps during the day. His appetite is increased nearly to baseline. Patient reports fatigue and endorses improved motivation. Patient denies any concerns for safety.   Review of systems is negative for any symptoms of mania. Patient denies any current suicidal ideations, homicidal ideations, plans, means, or intent. Patient denies any paranoia, auditory hallucinations, or visual hallucinations. Patient denies any delusional thought processes including thought insertion, thought withdrawal, or ideas of reference.    Principal Problem: MDD (major depressive disorder), recurrent severe, without psychosis (HCC) Diagnosis: Principal Problem:   MDD (major depressive disorder), recurrent severe, without psychosis (HCC) Active Problems:   Generalized anxiety disorder  Total Time spent : 30 minutes  Past Psychiatric History: Previous Psych Diagnoses: Anxiety, Depression, ADHD, Psychosis Prior inpatient treatment: prior psychiatric hospitalization twice in childhood Current/prior outpatient treatment: Talk therapy at [employer] student [facility] Prior rehab hx: Not applicable Psychotherapy hx:  History of suicide: Denies History of homicide or aggression: Not applicable Psychiatric medication history: Wellbutrin for depression, however in 2020 Wellbutrin dc due to seizures. Rx'd Prozac, filled prescription but never started Psychiatric  medication compliance history: Noncompliant Neuromodulation history: Not applicable Current Psychiatrist: None Current therapist: Garry Heater, LCSW at [employer]  Past Medical History:  Past Medical History:  Diagnosis Date   ADHD (attention deficit hyperactivity disorder)    Mental disorder    Psychosis (HCC)     Past Surgical History:  Procedure Laterality Date   WISDOM TOOTH EXTRACTION     Family History:  Family History  Problem Relation Age of Onset   Migraines Brother    Seizures Neg Hx    Depression Neg Hx    Anxiety disorder Neg Hx    ADD / ADHD Neg Hx    Autism Neg Hx     Social History:  Social History   Substance and Sexual Activity  Alcohol Use No     Social History   Substance and Sexual Activity  Drug Use Yes   Types: Marijuana   Comment: Previously, using once weekly. None since hospital on 12/17/21.    Social History   Socioeconomic History   Marital status: Single    Spouse name: Not on file   Number of children: 0   Years of education: 15 years   Highest education level: Some college, no degree  Occupational History   Not on file  Tobacco Use   Smoking status: Never   Smokeless tobacco: Never  Vaping Use   Vaping Use: Every day   Substances: THC, Synthetic cannabinoids   Devices: cannibus vape cartridges  Substance and Sexual Activity   Alcohol use: No   Drug use: Yes    Types: Marijuana    Comment: Previously, using once weekly. None since hospital on 12/17/21.   Sexual activity: Not Currently    Comment: pansexual  Other Topics Concern   Not on file  Social History Narrative   He lives with his mother and brother  during breaks from school. Lives in dorm during semesters.   Right-handed.   Archivist at [employer].   Social Determinants of Health   Financial Resource Strain: Not on file  Food Insecurity: No Food Insecurity (02/21/2023)   Hunger Vital Sign    Worried About Running Out of Food in the Last Year: Never true     Ran Out of Food in the Last Year: Never true  Transportation Needs: No Transportation Needs (02/21/2023)   PRAPARE - Administrator, Civil Service (Medical): No    Lack of Transportation (Non-Medical): No  Physical Activity: Not on file  Stress: Not on file  Social Connections: Not on file   Additional Social History:       Sleep: Poor  Appetite:  Fair  Current Medications: Current Facility-Administered Medications  Medication Dose Route Frequency Provider Last Rate Last Admin   acetaminophen (TYLENOL) tablet 650 mg  650 mg Oral Q6H PRN Dahlia Byes C, NP   650 mg at 02/24/23 0915   alum & mag hydroxide-simeth (MAALOX/MYLANTA) 200-200-20 MG/5ML suspension 30 mL  30 mL Oral Q4H PRN Earney Navy, NP       calcium-vitamin D (OSCAL WITH D) 500-5 MG-MCG per tablet 1 tablet  1 tablet Oral Q breakfast Dahlia Byes C, NP   1 tablet at 02/24/23 0746   diphenhydrAMINE (BENADRYL) capsule 50 mg  50 mg Oral TID PRN Earney Navy, NP       Or   diphenhydrAMINE (BENADRYL) injection 50 mg  50 mg Intramuscular TID PRN Dahlia Byes C, NP       haloperidol (HALDOL) tablet 5 mg  5 mg Oral TID PRN Dahlia Byes C, NP   5 mg at 02/22/23 1610   Or   haloperidol lactate (HALDOL) injection 5 mg  5 mg Intramuscular TID PRN Earney Navy, NP       hydrOXYzine (ATARAX) tablet 25 mg  25 mg Oral TID PRN Dahlia Byes C, NP   25 mg at 02/23/23 1012   LORazepam (ATIVAN) tablet 2 mg  2 mg Oral TID PRN Dahlia Byes C, NP   2 mg at 02/22/23 9604   Or   LORazepam (ATIVAN) injection 2 mg  2 mg Intramuscular TID PRN Dahlia Byes C, NP       magnesium hydroxide (MILK OF MAGNESIA) suspension 30 mL  30 mL Oral Daily PRN Dahlia Byes C, NP       melatonin tablet 5 mg  5 mg Oral QHS Onuoha, Josephine C, NP   5 mg at 02/23/23 2115   mirtazapine (REMERON) tablet 15 mg  15 mg Oral QHS Massengill, Harrold Donath, MD   15 mg at 02/23/23 2115   traZODone (DESYREL) tablet  50 mg  50 mg Oral QHS PRN Bobbitt, Shalon E, NP   50 mg at 02/23/23 2115   venlafaxine XR (EFFEXOR-XR) 24 hr capsule 75 mg  75 mg Oral Q breakfast Massengill, Harrold Donath, MD   75 mg at 02/24/23 0746   vitamin D3 (CHOLECALCIFEROL) tablet 1,000 Units  1,000 Units Oral BID Dahlia Byes C, NP   1,000 Units at 02/24/23 0747    Lab Results:  No results found for this or any previous visit (from the past 48 hour(s)).   Blood Alcohol level:  Lab Results  Component Value Date   ETH <10 02/21/2023   ETH <11 01/24/2013    Metabolic Disorder Labs: No results found for: "HGBA1C", "MPG" No results found for: "PROLACTIN" No  results found for: "CHOL", "TRIG", "HDL", "CHOLHDL", "VLDL", "LDLCALC"  Physical Findings: AIMS:  , ,  ,  ,    CIWA:    COWS:     Musculoskeletal: Strength & Muscle Tone: within normal limits Gait & Station: normal Patient leans: N/A  Psychiatric Specialty Exam:  Presentation  General Appearance:  Appropriate for Environment  Eye Contact: Good  Speech: Normal Rate  Speech Volume: Normal  Handedness: Right   Mood and Affect  Mood: Anxious Depressed Affect: Appropriate; Congruent   Thought Process  Thought Processes: Coherent  Descriptions of Associations:Intact  Orientation:Full (Time, Place and Person)  Thought Content:WDL  Hallucinations:Hallucinations: None  Ideas of Reference:None  Suicidal Thoughts:Suicidal Thoughts: No SI Active Intent and/or Plan: With Intent  Homicidal Thoughts:Homicidal Thoughts: No   Sensorium  Memory: Immediate Good  Judgment: Good  Insight: Good  Executive Functions  Concentration: Good  Attention Span: Fair  Recall: Good  Fund of Knowledge: Good  Language: Good  Psychomotor Activity  Psychomotor Activity: Psychomotor Activity: Normal  Assets  Assets: Communication Skills; Desire for Improvement; Vocational/Educational; Resilience; Social Support   Sleep  Sleep: Sleep:  Fair Number of Hours of Sleep: 5    Physical Exam:  Vitals reviewed.  Constitutional:      General: He is not in acute distress.    Appearance: He is normal weight. He is not toxic-appearing.  Pulmonary:     Effort: Pulmonary effort is normal. No respiratory distress.  Neurological:     Mental Status: He is alert.     Motor: No weakness.     Gait: Gait normal.  Psychiatric:        Behavior: Behavior normal.        Thought Content: Thought content normal.        Judgment: Judgment normal.      Review of Systems  Constitutional:  Negative for chills and fever.  Cardiovascular:  Negative for chest pain and palpitations.  Neurological:  Negative for dizziness, tingling, tremors and headaches.  Psychiatric/Behavioral:  Positive for depression, substance abuse . Negative for hallucinations and memory loss. Negative for delusions. The patient is nervous/anxious and has insomnia. All other systems reviewed and are negative.    Vitals:   02/23/23 1728 02/24/23 0630 02/24/23 0631 02/24/23 0724  BP: 119/70 101/73 (!) 104/52 115/89  Pulse: 87 61 81 63  Resp:      Temp:  (!) 97.4 F (36.3 C)    TempSrc:  Oral    SpO2: 100% 100%    Weight:      Height:         Treatment Plan Summary: Daily contact with patient to assess and evaluate symptoms and progress in treatment and Medication management Diagnoses / Active Problems: MDD GAD   PLAN: Safety and Monitoring:             - Voluntary admission to inpatient psychiatric unit for safety, stabilization and treatment             - Daily contact with patient to assess and evaluate symptoms and progress in treatment             - Patient's case to be discussed in multi-disciplinary team meeting             - Observation Level: q15 minute checks             - Vital signs: q12 hours             - Precautions: suicide,  elopement, and assault   2. Psychiatric Diagnoses and Treatment:    Major Depressive Disorder  Generalized Anxiety  Disorder  Insomnia -Stop Lexapro 5 mg PO daily (started by admission provider on 7/2) -Continue Remeron 15 mg PO at bedtime for insomnia, depression, poor appetite -Continue: Effexor-XR 75 mg PO daily for depression and anxiety - The risks/benefits/side-effects/alternatives to these medications were discussed in detail with the patient and time was given for questions. The patient consents to medication trial.                         - Encouraged patient to participate in unit milieu and in scheduled group therapies.              - Short Term Goals: Ability to identify changes in lifestyle to reduce recurrence of condition will improve. Ability to identify and develop effective coping behaviors will improve. Ability to maintain clinical measurements within normal limits will improve. Compliance with prescribed medications will improve and ability to identify triggers associated with substance abuse and insomnia issues will improve.             - Long Term Goals: Improvement in symptoms so as ready for safe discharge              3. Medical Issues Being Addressed:              -- not applicable    4. Discharge Planning:              -- Social work and case management to assist with discharge planning and identification of hospital follow-up needs prior to discharge             -- Estimated LOS: 3 days              -- Discharge Concerns: Need to establish a safety plan; Medication compliance and effectiveness             -- Discharge Goals: Return home with outpatient referrals for mental health follow-up including medication management/psychotherapy   Oletha Cruel, Medical Student

## 2023-02-24 NOTE — [facility] Group Notes (Signed)
Adult Psychoeducational Group Note  Date:  02/24/2023 Time:  11:34 AM  Group Topic/Focus:  Goals Group:   The focus of this group is to help patients establish daily goals to achieve during treatment and discuss how the patient can incorporate goal setting into their daily lives to aide in recovery. Orientation:   The focus of this group is to educate the patient on the purpose and policies of crisis stabilization and provide a format to answer questions about their admission.  The group details unit policies and expectations of patients while admitted.  Participation Level:  Active  Participation Quality:  Appropriate  Affect:  Appropriate  Cognitive:  Appropriate  Insight: Appropriate  Engagement in Group:  Engaged  Modes of Intervention:  Discussion and Education  Additional Comments:  Pt participated in group today. Pt stated their goal is to obtain better sleep. Pt was able to verbalize understanding of the self-awareness topic. Pt actively listened during discussion portion of group.    Sean Calderon 02/24/2023, 11:34 AM

## 2023-02-24 NOTE — [facility] Group Notes (Signed)
[facility] Group Notes:  (Nursing/MHT/Case Management/Adjunct)  Date:  02/24/2023  Time:  8:34 PM  Type of Therapy:   AA meeting  Participation Level:  Active  Participation Quality:  Appropriate  Affect:  Appropriate  Cognitive:  Appropriate  Insight:  Appropriate  Engagement in Group:  Engaged  Modes of Intervention:  Education  Summary of Progress/Problems: Attended AA meeting.  Sean Calderon 02/24/2023, 8:34 PM

## 2023-02-24 NOTE — Group Note (Signed)
Recreation Therapy Group Note   Group Topic:Team Building  Group Date: 02/24/2023 Start Time: 0932 End Time: 1000 Facilitators: Helaman Mecca-McCall, LRT,CTRS Location: [address] Dayroom   Goal Area(s) Addresses:  Patient will effectively work with peer towards shared goal.  Patient will identify skills used to make activity successful.  Patient will identify how skills used during activity can be used to reach post d/c goals.   Group Description: Straw Bridge. In teams of 3-5, patients were given 15 plastic drinking straws and an equal length of masking tape. Using the materials provided, patients were instructed to build a free standing bridge-like structure to suspend an everyday item (ex: puzzle box) off of the floor or table surface. All materials were required to be used by the team in their design. LRT facilitated post-activity discussion reviewing team process. Patients were encouraged to reflect how the skills used in this activity can be generalized to daily life post discharge.    Affect/Mood: Appropriate   Participation Level: Engaged   Participation Quality: Independent   Behavior: Appropriate   Speech/Thought Process: Focused   Insight: Good   Judgement: Good   Modes of Intervention: STEM Activity   Patient Response to Interventions:  Engaged   Education Outcome:  Acknowledges education   Clinical Observations/Individualized Feedback: Pt worked with peers in completing activity. Pt expressed the group had to use geometry to complete the activity.     Plan: Continue to engage patient in RT group sessions 2-3x/week.   Jasin Brazel-McCall, LRT,CTRS 02/24/2023 12:28 PM

## 2023-02-24 NOTE — Progress Notes (Signed)
   02/24/23 1100  Psych Admission Type (Psych Patients Only)  Admission Status Voluntary  Psychosocial Assessment  Patient Complaints Anxiety  Eye Contact Fair  Facial Expression Sad  Affect Depressed  Speech Logical/coherent  Interaction Assertive  Motor Activity Other (Comment) (WNL)  Appearance/Hygiene Unremarkable  Behavior Characteristics Cooperative  Mood Depressed;Anxious  Thought Process  Coherency WDL  Content WDL  Delusions None reported or observed  Perception WDL  Hallucination None reported or observed  Judgment Limited  Confusion None  Danger to Self  Current suicidal ideation? Denies  Self-Injurious Behavior No self-injurious ideation or behavior indicators observed or expressed   Agreement Not to Harm Self Yes  Description of Agreement verbal  Danger to Others  Danger to Others None reported or observed

## 2023-02-25 MED ORDER — TRAZODONE HCL 100 MG PO TABS
100.0000 mg | ORAL_TABLET | Freq: Every day | ORAL | Status: DC
  Administered 2023-02-25: 100 mg via ORAL
  Filled 2023-02-25 (×4): qty 1

## 2023-02-25 MED ORDER — CELECOXIB 100 MG PO CAPS
100.0000 mg | ORAL_CAPSULE | Freq: Every day | ORAL | Status: DC
  Administered 2023-02-25 – 2023-02-27 (×3): 100 mg via ORAL
  Filled 2023-02-25 (×6): qty 1

## 2023-02-25 MED ORDER — WHITE PETROLATUM EX OINT
TOPICAL_OINTMENT | CUTANEOUS | Status: AC
  Filled 2023-02-25: qty 5

## 2023-02-25 MED ORDER — VENLAFAXINE HCL ER 150 MG PO CP24
150.0000 mg | ORAL_CAPSULE | Freq: Every day | ORAL | Status: DC
  Administered 2023-02-26 – 2023-02-27 (×2): 150 mg via ORAL
  Filled 2023-02-25 (×4): qty 1

## 2023-02-25 NOTE — [facility] Suicide Risk Assessment (Signed)
[facility] INPATIENT:  Family/Significant Other Suicide Prevention Education  Suicide Prevention Education:  Contact Attempts: Neldon Mc ( mom ) [phone], has been identified by the patient as the family member/significant other with whom the patient will be residing, and identified as the person(s) who will aid the patient in the event of a mental health crisis.  With written consent from the patient, two attempts were made to provide suicide prevention education, prior to and/or following the patient's discharge.  We were unsuccessful in providing suicide prevention education.  A suicide education pamphlet was given to the patient to share with family/significant other.  Date and time of first attempt: 02/25/2023 / 1:30pm. Left voicemail requesting return call.  Date and time of second attempt: Second attempt to be made prior to discharge.   Patryck Kilgore A Tashiya Souders 02/25/2023, 1:32 PM

## 2023-02-25 NOTE — Group Note (Signed)
Date:  02/25/2023 Time:  3:56 PM  Group Topic/Focus:  Building Self Esteem:   The Focus of this group is helping patients become aware of the effects of self-esteem on their lives, the things they and others do that enhance or undermine their self-esteem, seeing the relationship between their level of self-esteem and the choices they make and learning ways to enhance self-esteem.    Participation Level:  Active  Participation Quality:  Appropriate  Affect:  Appropriate  Cognitive:  Appropriate  Insight: Appropriate  Engagement in Group:  Engaged  Modes of Intervention:  Activity  Additional Comments:     Reymundo Poll 02/25/2023, 3:56 PM

## 2023-02-25 NOTE — Progress Notes (Signed)
   02/25/23 1101  Psych Admission Type (Psych Patients Only)  Admission Status Voluntary  Psychosocial Assessment  Patient Complaints Insomnia  Eye Contact Fair  Facial Expression Flat  Affect Flat  Speech Logical/coherent  Interaction Assertive  Motor Activity Other (Comment) (WDL)  Appearance/Hygiene Unremarkable  Behavior Characteristics Cooperative  Mood Pleasant  Thought Process  Coherency WDL  Content WDL  Delusions None reported or observed  Perception WDL  Hallucination None reported or observed  Judgment Limited  Confusion None  Danger to Self  Current suicidal ideation? Denies  Self-Injurious Behavior No self-injurious ideation or behavior indicators observed or expressed   Agreement Not to Harm Self Yes  Description of Agreement verbal  Danger to Others  Danger to Others None reported or observed

## 2023-02-25 NOTE — [facility] Group Notes (Signed)
Adult Psychoeducational Group Note  Date:  02/25/2023 Time:  6:35 PM  Group Topic/Focus:  Support and Check In  Participation Level:  Active  Participation Quality:  Appropriate  Affect:  Appropriate  Cognitive:  Appropriate  Insight: Appropriate  Engagement in Group:  Engaged  Modes of Intervention:  Discussion  Additional Comments:  Pt discussed success in achieving goal set for the day.  Burlene Arnt 02/25/2023, 6:35 PM

## 2023-02-25 NOTE — [facility] Group Notes (Signed)
Adult Psychoeducational Group Note  Date:  02/25/2023 Time:  1:48 PM  Group Topic/Focus:  Goals Group:   The focus of this group is to help patients establish daily goals to achieve during treatment and discuss how the patient can incorporate goal setting into their daily lives to aide in recovery. Orientation:   The focus of this group is to educate the patient on the purpose and policies of crisis stabilization and provide a format to answer questions about their admission.  The group details unit policies and expectations of patients while admitted.  Participation Level:  Active  Participation Quality:  Appropriate  Affect:  Appropriate  Cognitive:  Appropriate  Insight: Appropriate  Engagement in Group:  Engaged  Modes of Intervention:  Discussion  Additional Comments:  Pt stated that his goal for today is to stay awake throughout the day. Pt also wanted to speak with social worker about discharge plans.   Benard Halsted Latressa Harries 02/25/2023, 1:48 PM

## 2023-02-25 NOTE — Progress Notes (Signed)
[facility] MD Progress Note  02/25/2023 2:10 PM Sean Calderon  MRN:  [id number]  Reason For Admission: Sean Calderon is a 22 y.o. male with past psychiatric history of depression, anxiety, cannabis abuse and psychosis requiring hospitalization who was admitted to the [facility] voluntarily from the ED for evaluation of suicidal ideation in the setting of worsening depression and  insomnia.   24 hr chart review: Vital signs within normal limits, patient compliant with medications, required trazodone 50 mg last night for sleep, required hydroxyzine 25 mg today early in the morning.  No behavioral issues reported overnight, attending unit group sessions and is appropriate as per nursing reports.  Patient assessment note: Pt presents with a depressed mood, attention to personal hygiene and grooming is fair, eye contact is good, speech is clear & coherent. Thought contents are organized and logical, and pt currently denies SI/HI/AVH or paranoia. There is no evidence of delusional thoughts.     Patient reports that last night, and sleep was poor, reports that he went to sleep at 9 PM, woke up at 12 AM, asked for something for sleep, was given trazodone 50 mg, went to sleep, but woke up again at 4 AM and was unable to sleep.  He is requesting an increase in the dose of his sleep medicine, we discussed increasing trazodone to 100 mg and schedule related in addition to Remeron 15 mg, to which patient was agreeable.  He reports her sleep is fair, but improving, denies any issues with his appetite.  He reports back pain that is chronic, states that he takes Celecoxib for pain prescribed by his PCP, and would like the medication here.  He is also perseverant about wanting to be discharged, states that he would like to discharge tomorrow, and states that he has an appointment for a job interview next Wednesday.  As per provider that saw him 2 days ago, projected discharge date is Monday 7/8.  We will  consider discharging him tomorrow 7/7 if CSW is able to safety plan with mother.  Continuing medications as listed below.   Principal Problem: MDD (major depressive disorder), recurrent severe, without psychosis (HCC) Diagnosis: Principal Problem:   MDD (major depressive disorder), recurrent severe, without psychosis (HCC) Active Problems:   Generalized anxiety disorder  Total Time spent : 30 minutes  Past Psychiatric History: Previous Psych Diagnoses: Anxiety, Depression, ADHD, Psychosis Prior inpatient treatment: prior psychiatric hospitalization twice in childhood Current/prior outpatient treatment: Talk therapy at [facility] student health services Prior rehab hx: Not applicable Psychotherapy hx:  History of suicide: Denies History of homicide or aggression: Not applicable Psychiatric medication history: Wellbutrin for depression, however in 2020 Wellbutrin dc due to seizures. Rx'd Prozac, filled prescription but never started Psychiatric medication compliance history: Noncompliant Neuromodulation history: Not applicable Current Psychiatrist: None Current therapist: Garry Heater, LCSW at [employer]  Past Medical History:  Past Medical History:  Diagnosis Date   ADHD (attention deficit hyperactivity disorder)    Mental disorder    Psychosis (HCC)     Past Surgical History:  Procedure Laterality Date   WISDOM TOOTH EXTRACTION     Family History:  Family History  Problem Relation Age of Onset   Migraines Brother    Seizures Neg Hx    Depression Neg Hx    Anxiety disorder Neg Hx    ADD / ADHD Neg Hx    Autism Neg Hx     Social History:  Social History   Substance and Sexual Activity  Alcohol  Use No     Social History   Substance and Sexual Activity  Drug Use Yes   Types: Marijuana   Comment: Previously, using once weekly. None since hospital on 12/17/21.    Social History   Socioeconomic History   Marital status: Single    Spouse name: Not on file   Number  of children: 0   Years of education: 15 years   Highest education level: Some college, no degree  Occupational History   Not on file  Tobacco Use   Smoking status: Never   Smokeless tobacco: Never  Vaping Use   Vaping Use: Every day   Substances: THC, Synthetic cannabinoids   Devices: cannibus vape cartridges  Substance and Sexual Activity   Alcohol use: No   Drug use: Yes    Types: Marijuana    Comment: Previously, using once weekly. None since hospital on 12/17/21.   Sexual activity: Not Currently    Comment: pansexual  Other Topics Concern   Not on file  Social History Narrative   He lives with his mother and brother during breaks from school. Lives in dorm during semesters.   Right-handed.   Archivist at [employer].   Social Determinants of Health   Financial Resource Strain: Not on file  Food Insecurity: No Food Insecurity (02/21/2023)   Hunger Vital Sign    Worried About Running Out of Food in the Last Year: Never true    Ran Out of Food in the Last Year: Never true  Transportation Needs: No Transportation Needs (02/21/2023)   PRAPARE - Administrator, Civil Service (Medical): No    Lack of Transportation (Non-Medical): No  Physical Activity: Not on file  Stress: Not on file  Social Connections: Not on file   Additional Social History:                         Sleep: Poor  Appetite:  Fair  Current Medications: Current Facility-Administered Medications  Medication Dose Route Frequency Provider Last Rate Last Admin   acetaminophen (TYLENOL) tablet 650 mg  650 mg Oral Q6H PRN Dahlia Byes C, NP   650 mg at 02/25/23 1139   alum & mag hydroxide-simeth (MAALOX/MYLANTA) 200-200-20 MG/5ML suspension 30 mL  30 mL Oral Q4H PRN Earney Navy, NP       calcium-vitamin D (OSCAL WITH D) 500-5 MG-MCG per tablet 1 tablet  1 tablet Oral Q breakfast Dahlia Byes C, NP   1 tablet at 02/25/23 0754   celecoxib (CELEBREX) capsule 100 mg  100 mg  Oral Daily Aadya Kindler, NP       diphenhydrAMINE (BENADRYL) capsule 50 mg  50 mg Oral TID PRN Dahlia Byes C, NP       Or   diphenhydrAMINE (BENADRYL) injection 50 mg  50 mg Intramuscular TID PRN Dahlia Byes C, NP       haloperidol (HALDOL) tablet 5 mg  5 mg Oral TID PRN Dahlia Byes C, NP   5 mg at 02/22/23 0454   Or   haloperidol lactate (HALDOL) injection 5 mg  5 mg Intramuscular TID PRN Dahlia Byes C, NP       hydrOXYzine (ATARAX) tablet 25 mg  25 mg Oral TID PRN Dahlia Byes C, NP   25 mg at 02/25/23 1140   LORazepam (ATIVAN) tablet 2 mg  2 mg Oral TID PRN Dahlia Byes C, NP   2 mg at 02/22/23 0981   Or  LORazepam (ATIVAN) injection 2 mg  2 mg Intramuscular TID PRN Dahlia Byes C, NP       magnesium hydroxide (MILK OF MAGNESIA) suspension 30 mL  30 mL Oral Daily PRN Dahlia Byes C, NP       melatonin tablet 5 mg  5 mg Oral QHS Onuoha, Josephine C, NP   5 mg at 02/24/23 2139   mirtazapine (REMERON) tablet 15 mg  15 mg Oral QHS Massengill, Harrold Donath, MD   15 mg at 02/24/23 2139   traZODone (DESYREL) tablet 100 mg  100 mg Oral QHS Starleen Blue, NP       [START ON 02/26/2023] venlafaxine XR (EFFEXOR-XR) 24 hr capsule 150 mg  150 mg Oral Q breakfast Payten Hobin, NP       vitamin D3 (CHOLECALCIFEROL) tablet 1,000 Units  1,000 Units Oral BID Dahlia Byes C, NP   1,000 Units at 02/25/23 0754    Lab Results:  No results found for this or any previous visit (from the past 48 hour(s)).   Blood Alcohol level:  Lab Results  Component Value Date   ETH <10 02/21/2023   ETH <11 01/24/2013    Metabolic Disorder Labs: No results found for: "HGBA1C", "MPG" No results found for: "PROLACTIN" No results found for: "CHOL", "TRIG", "HDL", "CHOLHDL", "VLDL", "LDLCALC"  Physical Findings: AIMS:  , ,  ,  ,    CIWA:    COWS:     Musculoskeletal: Strength & Muscle Tone: within normal limits Gait & Station: normal Patient leans: N/A  Psychiatric  Specialty Exam:  Presentation  General Appearance:  Appropriate for Environment  Eye Contact: Good  Speech: Clear and Coherent  Speech Volume: Normal  Handedness: Right   Mood and Affect  Mood: Anxious; Depressed Depressed Affect: Congruent   Thought Process  Thought Processes: Coherent  Descriptions of Associations:Intact  Orientation:Full (Time, Place and Person)  Thought Content:Logical  Hallucinations:Hallucinations: None  Ideas of Reference:None  Suicidal Thoughts:Suicidal Thoughts: No  Homicidal Thoughts:Homicidal Thoughts: No   Sensorium  Memory: Immediate Good  Judgment: Fair  Insight: Fair   Art therapist  Concentration: Fair  Attention Span: Good  Recall: Good  Fund of Knowledge: Good  Language: Good   Psychomotor Activity  Psychomotor Activity: Psychomotor Activity: Normal   Assets  Assets: Communication Skills; Resilience   Sleep  Sleep: Sleep: Fair Number of Hours of Sleep: 5    Physical Exam: Physical Exam Vitals reviewed.  Constitutional:      General: He is not in acute distress.    Appearance: He is normal weight. He is not toxic-appearing.  Pulmonary:     Effort: Pulmonary effort is normal. No respiratory distress.  Neurological:     Mental Status: He is alert.     Motor: No weakness.     Gait: Gait normal.  Psychiatric:        Behavior: Behavior normal.        Thought Content: Thought content normal.        Judgment: Judgment normal.      Review of Systems  Constitutional:  Negative for chills and fever.  Cardiovascular:  Negative for chest pain and palpitations.  Neurological:  Negative for dizziness, tingling, tremors and headaches.  Psychiatric/Behavioral:  Positive for depression, substance abuse . Negative for hallucinations and memory loss. The patient is nervous/anxious and has insomnia.   All other systems reviewed and are negative.  Blood pressure 122/88, pulse 61,  temperature 98.2 F (36.8 C), temperature source Oral, resp. rate  12, height 5\' 1"  (1.549 m), weight 47.8 kg, SpO2 100 %. Body mass index is 19.92 kg/m.   Treatment Plan Summary: Daily contact with patient to assess and evaluate symptoms and progress in treatment and Medication management Diagnoses / Active Problems: MDD GAD   PLAN: Safety and Monitoring:             - Voluntary admission to inpatient psychiatric unit for safety, stabilization and treatment             - Daily contact with patient to assess and evaluate symptoms and progress in treatment             - Patient's case to be discussed in multi-disciplinary team meeting             - Observation Level: q15 minute checks             - Vital signs: q12 hours             - Precautions: suicide, elopement, and assault   2. Psychiatric Diagnoses and Treatment:    Major Depressive Disorder  Generalized Anxiety Disorder  Insomnia - Previously stopped Lexapro 5 mg PO daily (started by admission provider on 7/2) -Continue Remeron 15 mg PO at bedtime for insomnia, depression, poor appetite -Continue: Increase Effexor-XR to 150 mg PO daily for depression and anxiety -Increase trazodone to 100 mg, and schedule it nightly for sleep -Start Celebrex 100 mg daily for back pain   - The risks/benefits/side-effects/alternatives to these medications were discussed in detail with the patient and time was given for questions. The patient consents to medication trial.                         - Encouraged patient to participate in unit milieu and in scheduled group therapies.              - Short Term Goals: Ability to identify changes in lifestyle to reduce recurrence of condition will improve. Ability to identify and develop effective coping behaviors will improve. Ability to maintain clinical measurements within normal limits will improve. Compliance with prescribed medications will improve and ability to identify triggers associated with  substance abuse and insomnia issues will improve.             - Long Term Goals: Improvement in symptoms so as ready for safe discharge              3. Medical Issues Being Addressed:              -- not applicable    4. Discharge Planning:              -- Social work and case management to assist with discharge planning and identification of hospital follow-up needs prior to discharge             -- Estimated LOS: 5-7 days              -- Discharge Concerns: Need to establish a safety plan; Medication compliance and effectiveness             -- Discharge Goals: Return home with outpatient referrals for mental health follow-up including medication management/psychotherapy   Starleen Blue, NP Patient ID: Sean Calderon, male   DOB: February 24, 2001, 22 y.o.   MRN: [id number]

## 2023-02-25 NOTE — Plan of Care (Signed)
  Problem: Education: Goal: Utilization of techniques to improve thought processes will improve Outcome: Progressing   Problem: Education: Goal: Knowledge of the prescribed therapeutic regimen will improve Outcome: Progressing   Problem: Coping: Goal: Coping ability will improve Outcome: Progressing   Problem: Safety: Goal: Ability to disclose and discuss suicidal ideas will improve Outcome: Progressing   Problem: Safety: Goal: Periods of time without injury will increase Outcome: Progressing

## 2023-02-25 NOTE — [facility] Group Notes (Signed)
Adult Psychoeducational Group Note  Date:  02/25/2023 Time:  9:07 PM  Group Topic/Focus:  Wrap-Up Group:   The focus of this group is to help patients review their daily goal of treatment and discuss progress on daily workbooks.  Participation Level:  Active  Participation Quality:  Appropriate  Affect:  Appropriate  Cognitive:  Appropriate  Insight: Appropriate  Engagement in Group:  Engaged  Modes of Intervention:  Discussion and Support  Additional Comments:  Pt told that today was a good day on the unit, the highlight of which was socializing with his peers in the dayroom. On the subject of goals for the coming week, Pt mentioned wanting to fix his sleep schedule and eventually discharge home. "I can fall asleep, but I'm having trouble staying asleep." Pt rated his day a 7 out of 10.  Christ Kick 02/25/2023, 9:07 PM

## 2023-02-25 NOTE — Progress Notes (Signed)
During this assessment, patient was tearful, anxious and worried about not being able to sleep at night. Patient rated his anxiety 7/10, depression 6/10, and denies pain. Patient denies SI/HI/AVH. He was able to contract for safety, emotional support provided and reassured that his sleeping medications will be administered to him for sleep. Patient compliant with medications, no side effects reported. Patient kept safe in his room and on the unit, q 15 minutes safety checks ongoing.   02/25/23 2100  Psych Admission Type (Psych Patients Only)  Admission Status Voluntary  Psychosocial Assessment  Patient Complaints Anxiety;Depression;Insomnia  Eye Contact Fair  Facial Expression Flat  Affect Flat  Speech Logical/coherent  Interaction Assertive  Motor Activity Other (Comment)  Appearance/Hygiene Unremarkable  Behavior Characteristics Cooperative  Mood Anxious;Depressed  Thought Process  Coherency WDL  Content WDL  Delusions None reported or observed  Perception WDL  Hallucination None reported or observed  Judgment Limited  Confusion None  Danger to Self  Current suicidal ideation? Denies  Self-Injurious Behavior No self-injurious ideation or behavior indicators observed or expressed   Agreement Not to Harm Self Yes  Description of Agreement Verbal  Danger to Others  Danger to Others None reported or observed

## 2023-02-26 DIAGNOSIS — F332 Major depressive disorder, recurrent severe without psychotic features: Secondary | ICD-10-CM

## 2023-02-26 MED ORDER — TRAZODONE HCL 150 MG PO TABS
150.0000 mg | ORAL_TABLET | Freq: Every day | ORAL | Status: DC
  Administered 2023-02-26: 150 mg via ORAL
  Filled 2023-02-26 (×4): qty 1

## 2023-02-26 NOTE — [facility] Suicide Risk Assessment (Signed)
[facility] INPATIENT:  Family/Significant Other Suicide Prevention Education  Suicide Prevention Education:  Education Completed; with patients mother Valaria Good , has been identified by the patient as the family member/significant other with whom the patient will be residing, and identified as the person(s) who will aid the patient in the event of a mental health crisis (suicidal ideations/suicide attempt).  With written consent from the patient, the family member/significant other has been provided the following suicide prevention education, prior to the and/or following the discharge of the patient.  Pt mother reports no concerns with the patient return home as long as his sleep is controlled and he's doing better mentally. Patients mother reports patient is safe to return, no means of self harm in the home. Patients mother expressed understanding of education provided and given opportunity to ask questions pertaining the patient care.     The suicide prevention education provided includes the following: Suicide risk factors Suicide prevention and interventions National Suicide Hotline telephone number [facility] assessment telephone number [facility] Emergency Assistance 911 [facility] and/or Residential Mobile Crisis Unit telephone number  Request made of family/significant other to: Remove weapons (e.g., guns, rifles, knives), all items previously/currently identified as safety concern.   Remove drugs/medications (over-the-counter, prescriptions, illicit drugs), all items previously/currently identified as a safety concern.  The family member/significant other verbalizes understanding of the suicide prevention education information provided.  The family member/significant other agrees to remove the items of safety concern listed above.  Bellah Alia LCSWA 02/26/2023, 12:00 PM

## 2023-02-26 NOTE — Plan of Care (Signed)
  Problem: Education: Goal: Utilization of techniques to improve thought processes will improve Outcome: Progressing   Problem: Education: Goal: Knowledge of the prescribed therapeutic regimen will improve Outcome: Progressing   Problem: Activity: Goal: Imbalance in normal sleep/wake cycle will improve Outcome: Progressing   Problem: Coping: Goal: Coping ability will improve Outcome: Progressing   Problem: Self-Concept: Goal: Level of anxiety will decrease Outcome: Progressing

## 2023-02-26 NOTE — Progress Notes (Signed)
   02/26/23 2200  Psych Admission Type (Psych Patients Only)  Admission Status Voluntary  Psychosocial Assessment  Patient Complaints Anxiety;Insomnia;Depression  Eye Contact Fair  Facial Expression Flat  Affect Flat  Speech Logical/coherent  Interaction Assertive  Motor Activity Other (Comment)  Appearance/Hygiene Unremarkable  Behavior Characteristics Cooperative  Mood Anxious  Thought Process  Coherency WDL  Content WDL  Delusions None reported or observed  Perception WDL  Hallucination None reported or observed  Judgment Limited  Confusion None  Danger to Self  Current suicidal ideation? Denies  Self-Injurious Behavior No self-injurious ideation or behavior indicators observed or expressed   Agreement Not to Harm Self Yes  Description of Agreement verbal  Danger to Others  Danger to Others None reported or observed

## 2023-02-26 NOTE — Group Note (Signed)
Date:  02/26/2023 Time:  3:23 PM  Group Topic/Focus:   Developing a Wellness Toolbox:   The focus of this group is to help patients develop a "wellness toolbox" with skills and strategies to promote recovery upon discharge. Skills and strategies discussed included eating healthy, exercise, maintaining appropriate physical health, keeping the brain stimulated with reading, puzzles, intellectual conversations, and being social.     Participation Level:  Active  Participation Quality:  Appropriate  Affect:  Appropriate  Cognitive:  Alert  Insight: Appropriate and Good  Engagement in Group:  Engaged  Modes of Intervention:  Discussion and Education  Additional Comments:  n/a  Cherre Blanc 02/26/2023, 3:23 PM

## 2023-02-26 NOTE — Progress Notes (Signed)
   02/26/23 0626  15 Minute Checks  Location Bedroom  Visual Appearance Calm  Behavior Composed  Sleep (Behavioral Health Patients Only)  Calculate sleep? (Click Yes once per 24 hr at 0600 safety check) Yes  Documented sleep last 24 hours 7    

## 2023-02-26 NOTE — [facility] Group Notes (Signed)
The focus of this group is to help patients establish daily goals to achieve during treatment and discuss how the patient can incorporate goal setting into their daily lives to aide in recovery.  Pt did not attend group 

## 2023-02-26 NOTE — Progress Notes (Signed)
[facility] MD Progress Note  02/26/2023 2:18 PM Sean Calderon  MRN:  [id number]  Reason For Admission: Sean Calderon is a 22 y.o. male with past psychiatric history of depression, anxiety, cannabis abuse and psychosis requiring hospitalization who was admitted to the [facility] voluntarily from the ED for evaluation of suicidal ideation in the setting of worsening depression and  insomnia.   24 hr chart review: Vital signs within normal limits, patient remains compliant with medications, required trazodone 100 mg last night for sleep.  No behavioral issues reported overnight, attending unit group sessions and is appropriate as per nursing reports.  Patient assessment note: Presents today with a slightly less depressed mood as compared to time of hospitalization. Attention to personal hygiene and grooming is fair, eye contact is good, speech is clear & coherent. Thought contents are organized and logical, and pt currently denies SI/HI/AVH or paranoia. There is no evidence of delusional thoughts.    Patient reports that his sleep quality last night was better than the night prior, but he slept until 4 or 5 AM and was unable to fall back asleep.  He reports that his appetite has improved, and he is eating much better.  He denies being in any physical pain today with the addition of the Celebrex to medication regimen, denies medication related side effects.  As per provider that saw him 2 days ago, projected discharge date was Monday 7/8.  We considered discharging him today, 7/7 & CSW was able to safety plan with mother.  Patient's mother stated that she would be comfortable with patient returning home only if his sleep problems have been corrected.  We educated patient that since last night was the first night that he reported having 6 hours of sleep, it is important that he stays up at night, and if he is able to have another good night of sleep tonight, we will discharge him tomorrow 7/8.   Patient verbalized understanding, and is in agreement with this.  Continuing medications as listed below.    We are completely discontinuing the Remeron due to lack of effectiveness, and increasing trazodone to 150 mg nightly to maximize its sleep potential, and also antidepressant effect.  Continuing other medications as listed below.  Labs reviewed: Ordered TSH, hemoglobin A1c, lipid panel.  QTc is within normal limits.  Principal Problem: MDD (major depressive disorder), recurrent severe, without psychosis (HCC) Diagnosis: Principal Problem:   MDD (major depressive disorder), recurrent severe, without psychosis (HCC) Active Problems:   Generalized anxiety disorder  Total Time spent : 30 minutes  Past Psychiatric History: Previous Psych Diagnoses: Anxiety, Depression, ADHD, Psychosis Prior inpatient treatment: prior psychiatric hospitalization twice in childhood Current/prior outpatient treatment: Talk therapy at [facility] student health services Prior rehab hx: Not applicable Psychotherapy hx:  History of suicide: Denies History of homicide or aggression: Not applicable Psychiatric medication history: Wellbutrin for depression, however in 2020 Wellbutrin dc due to seizures. Rx'd Prozac, filled prescription but never started Psychiatric medication compliance history: Noncompliant Neuromodulation history: Not applicable Current Psychiatrist: None Current therapist: Garry Heater, LCSW at [employer]  Past Medical History:  Past Medical History:  Diagnosis Date   ADHD (attention deficit hyperactivity disorder)    Mental disorder    Psychosis (HCC)     Past Surgical History:  Procedure Laterality Date   WISDOM TOOTH EXTRACTION     Family History:  Family History  Problem Relation Age of Onset   Migraines Brother    Seizures Neg Hx  Depression Neg Hx    Anxiety disorder Neg Hx    ADD / ADHD Neg Hx    Autism Neg Hx     Social History:  Social History   Substance and  Sexual Activity  Alcohol Use No     Social History   Substance and Sexual Activity  Drug Use Yes   Types: Marijuana   Comment: Previously, using once weekly. None since hospital on 12/17/21.    Social History   Socioeconomic History   Marital status: Single    Spouse name: Not on file   Number of children: 0   Years of education: 15 years   Highest education level: Some college, no degree  Occupational History   Not on file  Tobacco Use   Smoking status: Never   Smokeless tobacco: Never  Vaping Use   Vaping Use: Every day   Substances: THC, Synthetic cannabinoids   Devices: cannibus vape cartridges  Substance and Sexual Activity   Alcohol use: No   Drug use: Yes    Types: Marijuana    Comment: Previously, using once weekly. None since hospital on 12/17/21.   Sexual activity: Not Currently    Comment: pansexual  Other Topics Concern   Not on file  Social History Narrative   He lives with his mother and brother during breaks from school. Lives in dorm during semesters.   Right-handed.   Archivist at [employer].   Social Determinants of Health   Financial Resource Strain: Not on file  Food Insecurity: No Food Insecurity (02/21/2023)   Hunger Vital Sign    Worried About Running Out of Food in the Last Year: Never true    Ran Out of Food in the Last Year: Never true  Transportation Needs: No Transportation Needs (02/21/2023)   PRAPARE - Administrator, Civil Service (Medical): No    Lack of Transportation (Non-Medical): No  Physical Activity: Not on file  Stress: Not on file  Social Connections: Not on file   Sleep: Poor  Appetite:  Fair  Current Medications: Current Facility-Administered Medications  Medication Dose Route Frequency Provider Last Rate Last Admin   acetaminophen (TYLENOL) tablet 650 mg  650 mg Oral Q6H PRN Dahlia Byes C, NP   650 mg at 02/25/23 1139   alum & mag hydroxide-simeth (MAALOX/MYLANTA) 200-200-20 MG/5ML suspension 30 mL   30 mL Oral Q4H PRN Earney Navy, NP       calcium-vitamin D (OSCAL WITH D) 500-5 MG-MCG per tablet 1 tablet  1 tablet Oral Q breakfast Dahlia Byes C, NP   1 tablet at 02/26/23 0915   celecoxib (CELEBREX) capsule 100 mg  100 mg Oral Daily Starleen Blue, NP   100 mg at 02/26/23 0915   diphenhydrAMINE (BENADRYL) capsule 50 mg  50 mg Oral TID PRN Earney Navy, NP       Or   diphenhydrAMINE (BENADRYL) injection 50 mg  50 mg Intramuscular TID PRN Dahlia Byes C, NP       haloperidol (HALDOL) tablet 5 mg  5 mg Oral TID PRN Dahlia Byes C, NP   5 mg at 02/22/23 1610   Or   haloperidol lactate (HALDOL) injection 5 mg  5 mg Intramuscular TID PRN Dahlia Byes C, NP       hydrOXYzine (ATARAX) tablet 25 mg  25 mg Oral TID PRN Dahlia Byes C, NP   25 mg at 02/25/23 1140   LORazepam (ATIVAN) tablet 2 mg  2 mg  Oral TID PRN Dahlia Byes C, NP   2 mg at 02/22/23 1191   Or   LORazepam (ATIVAN) injection 2 mg  2 mg Intramuscular TID PRN Dahlia Byes C, NP       magnesium hydroxide (MILK OF MAGNESIA) suspension 30 mL  30 mL Oral Daily PRN Dahlia Byes C, NP       melatonin tablet 5 mg  5 mg Oral QHS Onuoha, Josephine C, NP   5 mg at 02/25/23 2126   traZODone (DESYREL) tablet 150 mg  150 mg Oral QHS Starleen Blue, NP       venlafaxine XR (EFFEXOR-XR) 24 hr capsule 150 mg  150 mg Oral Q breakfast Starleen Blue, NP   150 mg at 02/26/23 0913   vitamin D3 (CHOLECALCIFEROL) tablet 1,000 Units  1,000 Units Oral BID Dahlia Byes C, NP   1,000 Units at 02/26/23 0913    Lab Results:  No results found for this or any previous visit (from the past 48 hour(s)).   Blood Alcohol level:  Lab Results  Component Value Date   ETH <10 02/21/2023   ETH <11 01/24/2013    Metabolic Disorder Labs: No results found for: "HGBA1C", "MPG" No results found for: "PROLACTIN" No results found for: "CHOL", "TRIG", "HDL", "CHOLHDL", "VLDL", "LDLCALC"  Physical  Findings: AIMS:  , ,  ,  ,    CIWA:    COWS:     Musculoskeletal: Strength & Muscle Tone: within normal limits Gait & Station: normal Patient leans: N/A  Psychiatric Specialty Exam:  Presentation  General Appearance:  Well Groomed  Eye Contact: Good  Speech: Clear and Coherent  Speech Volume: Normal  Handedness: Right   Mood and Affect  Mood: Euthymic Depressed Affect: Congruent   Thought Process  Thought Processes: Coherent  Descriptions of Associations:Intact  Orientation:Full (Time, Place and Person)  Thought Content:Logical  Hallucinations:Hallucinations: None  Ideas of Reference:None  Suicidal Thoughts:Suicidal Thoughts: No  Homicidal Thoughts:Homicidal Thoughts: No   Sensorium  Memory: Immediate Good  Judgment: Fair  Insight: Fair   Art therapist  Concentration: Fair  Attention Span: Good  Recall: Fair  Fund of Knowledge: Fair  Language: Fair   Psychomotor Activity  Psychomotor Activity: Psychomotor Activity: Normal   Assets  Assets: Communication Skills   Sleep  Sleep: Sleep: Fair    Physical Exam: Physical Exam Vitals reviewed.  Constitutional:      General: He is not in acute distress.    Appearance: He is normal weight. He is not toxic-appearing.  Pulmonary:     Effort: Pulmonary effort is normal. No respiratory distress.  Neurological:     Mental Status: He is alert.     Motor: No weakness.     Gait: Gait normal.  Psychiatric:        Behavior: Behavior normal.        Thought Content: Thought content normal.        Judgment: Judgment normal.      Review of Systems  Constitutional:  Negative for chills and fever.  Cardiovascular:  Negative for chest pain and palpitations.  Neurological:  Negative for dizziness, tingling, tremors and headaches.  Psychiatric/Behavioral:  Positive for depression, substance abuse . Negative for hallucinations and memory loss. The patient is  nervous/anxious and has insomnia.   All other systems reviewed and are negative.  Blood pressure 105/74, pulse 66, temperature 97.9 F (36.6 C), temperature source Oral, resp. rate 14, height 5\' 1"  (1.549 m), weight 47.8 kg, SpO2 100 %.  Body mass index is 19.92 kg/m.   Treatment Plan Summary: Daily contact with patient to assess and evaluate symptoms and progress in treatment and Medication management Diagnoses / Active Problems: MDD GAD   PLAN: Safety and Monitoring:             - Voluntary admission to inpatient psychiatric unit for safety, stabilization and treatment             - Daily contact with patient to assess and evaluate symptoms and progress in treatment             - Patient's case to be discussed in multi-disciplinary team meeting             - Observation Level: q15 minute checks             - Vital signs: q12 hours             - Precautions: suicide, elopement, and assault   2. Psychiatric Diagnoses and Treatment:    Major Depressive Disorder  Generalized Anxiety Disorder  Insomnia - Previously stopped Lexapro 5 mg PO daily (started by admission provider on 7/2) -Discontinue Remeron 15 mg as med has been ineffective and pt has concerns for polypharmacy. -Continue Effexor-XR to 150 mg PO daily for MDD/GAD -Increase  trazodone to 150 mg, at bedtime for insomnia -Continue Celebrex 100 mg daily for back pain   - The risks/benefits/side-effects/alternatives to these medications were discussed in detail with the patient and time was given for questions. The patient consents to medication trial.                         - Encouraged patient to participate in unit milieu and in scheduled group therapies.              - Short Term Goals: Ability to identify changes in lifestyle to reduce recurrence of condition will improve. Ability to identify and develop effective coping behaviors will improve. Ability to maintain clinical measurements within normal limits will improve.  Compliance with prescribed medications will improve and ability to identify triggers associated with substance abuse and insomnia issues will improve.             - Long Term Goals: Improvement in symptoms so as ready for safe discharge              3. Medical Issues Being Addressed:              -- not applicable    4. Discharge Planning:              -- Social work and case management to assist with discharge planning and identification of hospital follow-up needs prior to discharge             -- Estimated LOS: 5-7 days              -- Discharge Concerns: Need to establish a safety plan; Medication compliance and effectiveness             -- Discharge Goals: Return home with outpatient referrals for mental health follow-up including medication management/psychotherapy   Starleen Blue, NP Patient ID: Yordin Mais, male   DOB: 2001/05/08, 22 y.o.   MRN: [id number] Patient ID: Chemar Merrithew, male   DOB: 2001/03/04, 22 y.o.   MRN: [id number]

## 2023-02-26 NOTE — Group Note (Signed)
Date:  02/26/2023 Time:  10:30 PM  Group Topic/Focus:  Wrap-Up Group:   The focus of this group is to help patients review their daily goal of treatment and discuss progress on daily workbooks.    Participation Level:  Active  Participation Quality:  Appropriate and Attentive  Affect:  Appropriate  Cognitive:  Alert and Appropriate  Insight: Appropriate  Engagement in Group:  Engaged  Modes of Intervention:  Activity and Exploration  Additional Comments:  None  [name] 02/26/2023, 10:30 PM

## 2023-02-27 LAB — LIPID PANEL
Cholesterol: 171 mg/dL (ref 0–200)
HDL: 45 mg/dL (ref >40)
LDL Cholesterol: 117 mg/dL — ABNORMAL HIGH (ref 0–99)
Total CHOL/HDL Ratio: 3.8 RATIO
Triglycerides: 47 mg/dL (ref <150)
VLDL: 9 mg/dL (ref 0–40)

## 2023-02-27 LAB — TSH: TSH: 0.708 u[IU]/mL (ref 0.350–4.500)

## 2023-02-27 MED ORDER — ACETAMINOPHEN 325 MG PO TABS: 650.0000 mg | ORAL_TABLET | Freq: Four times a day (QID) | ORAL | Status: AC | PRN

## 2023-02-27 MED ORDER — TRAZODONE HCL 150 MG PO TABS: 150.0000 mg | ORAL_TABLET | Freq: Every day | ORAL | 0 refills | Status: AC

## 2023-02-27 MED ORDER — VENLAFAXINE HCL ER 150 MG PO CP24: 150.0000 mg | ORAL_CAPSULE | Freq: Every day | ORAL | 0 refills | Status: AC

## 2023-02-27 MED ORDER — HYDROXYZINE HCL 25 MG PO TABS: 25.0000 mg | ORAL_TABLET | Freq: Three times a day (TID) | ORAL | 0 refills | Status: AC | PRN

## 2023-02-27 MED ORDER — MELATONIN 5 MG PO TABS: 5.0000 mg | ORAL_TABLET | Freq: Every day | ORAL | 0 refills | Status: AC

## 2023-02-27 MED ORDER — CELECOXIB 100 MG PO CAPS: 100.0000 mg | ORAL_CAPSULE | Freq: Every day | ORAL | 0 refills | Status: AC

## 2023-02-27 NOTE — Group Note (Signed)
Recreation Therapy Group Note   Group Topic:Team Building  Group Date: 02/27/2023 Start Time: 0930 End Time: 1005 Facilitators: Deunta Beneke-McCall, LRT,CTRS Location: [address] Day[address]   Goal Area(s) Addresses:  Patient will effectively work with peer towards shared goal.  Patient will identify skills used to make activity successful.  Patient will identify how skills used during activity can be applied to reach post d/c goals.   Group Description: [employer]. In teams of 5-6, patients were given 11 craft pipe cleaners. Using the materials provided, patients were instructed to compete again the opposing team(s) to build the tallest free-standing structure from floor level. The activity was timed; difficulty increased by Clinical research associate as Production designer, theatre/television/film continued.  Systematically resources were removed with additional directions for example, placing one arm behind their back, working in silence, and shape stipulations. LRT facilitated post-activity discussion reviewing team processes and necessary communication skills involved in completion. Patients were encouraged to reflect how the skills utilized, or not utilized, in this activity can be incorporated to positively impact support systems post discharge.   Affect/Mood: Appropriate   Participation Level: Engaged   Participation Quality: Independent   Behavior: Appropriate   Speech/Thought Process: Focused   Insight: Good   Judgement: Good   Modes of Intervention: STEM Activity   Patient Response to Interventions:  Engaged   Education Outcome:  Acknowledges education   Clinical Observations/Individualized Feedback: Pt was attentive and engaged with peers. Pt also provided some suggestions as well working through suggestions of his peers.    Plan: Continue to engage patient in RT group sessions 2-3x/week.   Latonya Knight-McCall, LRT,CTRS 02/27/2023 12:10 PM

## 2023-02-27 NOTE — [facility] Suicide Risk Assessment (Signed)
Suicide Risk Assessment  Discharge Assessment    [facility] Discharge Suicide Risk Assessment   Principal Problem: MDD (major depressive disorder), recurrent severe, without psychosis (HCC) Discharge Diagnoses: Principal Problem:   MDD (major depressive disorder), recurrent severe, without psychosis (HCC) Active Problems:   Generalized anxiety disorder  During the patient's hospitalization, patient had extensive initial psychiatric evaluation, and follow-up psychiatric evaluations every day.  Psychiatric diagnoses provided upon initial assessment: MDD (major depressive disorder), recurrent severe, without psychosis and Generalized anxiety disorder   Patient's psychiatric medications were adjusted on admission: Stopped his Lexapro and started Remeron to address his sleep and he was also started on Effexor.    During the hospitalization, other adjustments were made to the patient's psychiatric medication regimen: Due to none response of the Remeron and his concerns for Polypharmacy this was stopped prior to discharge.  Gradually, patient started adjusting to milieu.   Patient's care was discussed during the interdisciplinary team meeting every day during the hospitalization.  The patient is not having side effects to prescribed psychiatric medication.  The patient reports their target psychiatric symptoms of depression, insomnia, and poor appetite responded well to the psychiatric medications, and the patient reports overall benefit other psychiatric hospitalization. Supportive psychotherapy was provided to the patient. The patient also participated in regular group therapy while admitted.   Labs were reviewed with the patient, and abnormal results were discussed with the patient.  The patient denied having suicidal thoughts more than 48 hours prior to discharge.  Patient denies having homicidal thoughts.  Patient denies having auditory hallucinations.  Patient denies any visual hallucinations.   Patient denies having paranoid thoughts.  The patient is able to verbalize their individual safety plan to this provider.  It is recommended to the patient to continue psychiatric medications as prescribed, after discharge from the hospital.    It is recommended to the patient to follow up with your outpatient psychiatric provider and PCP.  Discussed with the patient, the impact of alcohol, drugs, tobacco have been there overall psychiatric and medical wellbeing, and total abstinence from substance use was recommended the patient.  Total Time spent with patient: 20 minutes  Musculoskeletal: Strength & Muscle Tone: within normal limits Gait & Station: normal Patient leans: N/A  Psychiatric Specialty Exam  Presentation  General Appearance:  Appropriate for Environment; Casual; Fairly Groomed  Eye Contact: Good  Speech: Normal Rate; Clear and Coherent  Speech Volume: Normal  Handedness: Right   Mood and Affect  Mood: Euthymic  Duration of Depression Symptoms: No data recorded Affect: Appropriate; Congruent; Full Range   Thought Process  Thought Processes: Linear  Descriptions of Associations:Intact  Orientation:Full (Time, Place and Person)  Thought Content:Logical  History of Schizophrenia/Schizoaffective disorder:No  Duration of Psychotic Symptoms:N/A  Hallucinations:Hallucinations: None  Ideas of Reference:None  Suicidal Thoughts:Suicidal Thoughts: No  Homicidal Thoughts:Homicidal Thoughts: No   Sensorium  Memory: Immediate Good; Recent Good; Remote Good  Judgment: Good  Insight: Good   Executive Functions  Concentration: Good  Attention Span: Good  Recall: Good  Fund of Knowledge: Good  Language: Good   Psychomotor Activity  Psychomotor Activity: Psychomotor Activity: Normal   Assets  Assets: Communication Skills   Sleep  Sleep: Sleep: Fair   Physical Exam: Physical Exam Vitals and nursing note reviewed.   Constitutional:      General: He is not in acute distress.    Appearance: Normal appearance. He is normal weight. He is not ill-appearing or toxic-appearing.  HENT:     Head:  Normocephalic and atraumatic.  Pulmonary:     Effort: Pulmonary effort is normal.  Musculoskeletal:        General: Normal range of motion.  Neurological:     General: No focal deficit present.     Mental Status: He is alert.    Review of Systems  Respiratory:  Negative for cough and shortness of breath.   Cardiovascular:  Negative for chest pain.  Gastrointestinal:  Negative for abdominal pain, constipation, diarrhea, nausea and vomiting.  Neurological:  Negative for dizziness, weakness and headaches.  Psychiatric/Behavioral:  Negative for depression, hallucinations and suicidal ideas. The patient is not nervous/anxious.    Blood pressure 128/75, pulse 82, temperature 98.2 F (36.8 C), temperature source Oral, resp. rate 20, height 5\' 1"  (1.549 m), weight 47.8 kg, SpO2 100 %. Body mass index is 19.92 kg/m.  Mental Status Per Nursing Assessment::   On Admission:  Suicidal ideation indicated by patient  Demographic Factors:  Male and Adolescent or young adult  Loss Factors: NA  Historical Factors: NA  Risk Reduction Factors:   Sense of responsibility to family, Living with another person, especially a relative, and Positive therapeutic relationship  Continued Clinical Symptoms:  More than one psychiatric diagnosis Previous Psychiatric Diagnoses and Treatments Medical Diagnoses and Treatments/Surgeries  Cognitive Features That Contribute To Risk:  None    Suicide Risk:  Minimal: No identifiable suicidal ideation.  Patients presenting with no risk factors but with morbid ruminations; may be classified as minimal risk based on the severity of the depressive symptoms   Follow-up Information     [facility]. Go to.   Specialty: Behavioral Health Why: For fastest  service, please go to this provider at 7:00 am, Monday through Friday, to obtain therapy services. You may also call to schedule an appointment, however, the appointments are out to end of August and beyond. Contact information: [address] [place] [address] [phone]        [facility] Go on 03/15/2023.   Why: You have an appointment for medication management services on  03/15/23 at 3:00 pm.   This appointment will be held in person. Contact information: [address] [place] [address] [phone]                 Plan Of Care/Follow-up recommendations:  Activity: as tolerated  Diet: heart healthy  Other: -Follow-up with your outpatient psychiatric provider -instructions on appointment date, time, and address (location) are provided to you in discharge paperwork.  -Take your psychiatric medications as prescribed at discharge - instructions are provided to you in the discharge paperwork  -Follow-up with outpatient primary care doctor and other specialists -for management of chronic medical disease, including: Chronic Back Pain  -Testing: Follow-up with outpatient provider for abnormal lab results: Slightly Elevated LDL  -Recommend abstinence from alcohol, tobacco, and other illicit drug use at discharge.   -If your psychiatric symptoms recur, worsen, or if you have side effects to your psychiatric medications, call your outpatient psychiatric provider, 911, [phone] or go to the nearest emergency department.  -If suicidal thoughts recur, call your outpatient psychiatric provider, 911, [phone] or go to the nearest emergency department.   Lauro Franklin, MD 02/27/2023, 10:09 AM

## 2023-02-27 NOTE — Discharge Instructions (Signed)
-  Follow-up with your outpatient psychiatric provider -instructions on appointment date, time, and address (location) are provided to you in discharge paperwork.  -Take your psychiatric medications as prescribed at discharge - instructions are provided to you in the discharge paperwork  -Follow-up with outpatient primary care doctor and other specialists -for management of preventative medicine and any chronic medical disease.  -Recommend abstinence from alcohol, tobacco, and other illicit drug use at discharge.   -If your psychiatric symptoms recur, worsen, or if you have side effects to your psychiatric medications, call your outpatient psychiatric provider, 911, [phone] or go to the nearest emergency department.  -If suicidal thoughts occur, call your outpatient psychiatric provider, 911, [phone] or go to the nearest emergency department.  Naloxone (Narcan) can help reverse an overdose when given to the victim quickly.  [place] offers free naloxone kits and instructions/training on its use.  Add naloxone to your first aid kit and you can help save a life.   Pick up your free kit at the following locations:   [place]:  [facility], [address] [place] [address] ([phone]) [facility] [address] [place] [id number] ([phone]) [facility] [address]  [place] [address]  [place]: [facility] [address] [place] [address] ([phone]) [facility] [address] [place] [address] ([phone])  

## 2023-02-27 NOTE — Progress Notes (Signed)
  [facility] Adult Case Management Discharge Plan :  Will you be returning to the same living situation after discharge:  Yes,  Patient will be returning back with mom  At discharge, do you have transportation home?: Yes,  Mom will pick him up at Noon Do you have the ability to pay for your medications: Yes,  [place] Medicaid Prepaid Health Plan  Release of information consent forms completed and in the chart;  Patient's signature needed at discharge.  Patient to Follow up at:  Follow-up Information     [facility]. Go to.   Specialty: Behavioral Health Why: For fastest service, please go to this provider at 7:00 am, Monday through Friday, to obtain therapy services. You may also call to schedule an appointment, however, the appointments are out to end of August and beyond. Contact information: [address] [place] [address] [phone]        [facility] Go on 03/15/2023.   Why: You have an appointment for medication management services on  03/15/23 at 3:00 pm.   This appointment will be held in person. Contact information: [address] [place] [address] [phone]                 Next level of care provider has access to [facility] Link:yes  Safety Planning and Suicide Prevention discussed: Yes,  Neldon Mc ( mom ) [phone]      Has patient been referred to the Quitline?: Patient does not use tobacco/nicotine products; States that he does smoke marijuana at times  Patient has been referred for addiction treatment: No known substance use disorder.  Isabella Bowens, LCSWA 02/27/2023, 9:34 AM

## 2023-02-27 NOTE — Progress Notes (Signed)
Pt discharged to lobby. Pt was stable and appreciative at that time. All papers and prescriptions were given and valuables returned. Verbal understanding expressed. Denies SI/HI and A/VH. Pt given opportunity to express concerns and ask questions.  

## 2023-02-27 NOTE — Progress Notes (Signed)
   02/27/23 0630  15 Minute Checks  Location Bedroom  Visual Appearance Calm  Behavior Composed  Sleep (Behavioral Health Patients Only)  Calculate sleep? (Click Yes once per 24 hr at 0600 safety check) Yes  Documented sleep last 24 hours 7.75

## 2023-02-27 NOTE — Discharge Summary (Signed)
Physician Discharge Summary  Patient ID: Sean Calderon MRN: [id number] DOB/AGE: Nov 30, 2000 22 y.o.  Admit date: 02/21/2023 Discharge date: 02/27/2023  Admission Diagnoses:  MDD (major depressive disorder), recurrent severe, without psychosis (HCC)  Generalized anxiety disorder   Reason for Admission: Sean Calderon is a 22 y.o. male with past psychiatric history of depression, anxiety, cannabis abuse and psychosis requiring hospitalization who was admitted to the [facility] voluntarily from the ED for evaluation of suicidal ideation in the setting of worsening depression and insomnia.   Patient reports psychiatric decompensation for about 1 month with worsening depression and insomnia. Patient reports history of insomnia that worsened during the last 7-8 days and sleeping at most 3 hours per night. He reports difficulty falling asleep, staying asleep through the night. He reports 2 recent episodes of jolting awake and feeling sense of impending doom which scared him. Reports difficulty with initiating and maintaining sleep due to anxiety and depression, and also reports back pain causing poor sleep (sleeping on sofa at home). Patient was noted to be tearful during assessment interview. Reports anxiety level is elevated, generalized, chronic, excessive impairing concentration and sleep. Patient reports living with his mother and 2 brothers and sleeping on the couch. He reports becoming overwhelmed and frustrated with inability to sleep and told his older brother, "I want this to be over." and the brother drove him to the ED. Patient reports tearfully, "I just wanted relief from this stuff going on and on." He reports low mood and feelings of hopelessness. He reports low energy and difficult concentrating and "brain fog". Patient reports loss of interest in activities he usually enjoys. His appetite is intact.   Discharge Diagnoses:  MDD (major depressive disorder), recurrent severe,  without psychosis (HCC)  Generalized anxiety disorder   Hospital Course: During the patient's hospitalization, patient had extensive initial psychiatric evaluation, and follow-up psychiatric evaluations every day.   Psychiatric diagnoses provided upon initial assessment: MDD (major depressive disorder), recurrent severe, without psychosis (HCC)   Patient's psychiatric medications were adjusted on admission: Stopped his Lexapro and started Remeron to address his sleep and he was also started on Effexor.    During the hospitalization, other adjustments were made to the patient's psychiatric medication regimen: Due to none response of the Remeron and his concerns for Polypharmacy this was stopped prior to discharge.   Patient's care was discussed during the interdisciplinary team meeting every day during the hospitalization.  The patient is not having side effects to prescribed psychiatric medication.  Gradually, patient started adjusting to milieu. The patient was evaluated each day by a clinical provider to ascertain response to treatment. Improvement was noted by the patient's report of decreasing symptoms, improved sleep and appetite, affect, medication tolerance, behavior, and participation in unit programming.  Patient was asked each day to complete a self inventory noting mood, mental status, pain, new symptoms, anxiety and concerns.   Symptoms were reported as significantly decreased or resolved completely by discharge.  The patient reports that their mood is stable.  The patient denied having suicidal thoughts for more than 48 hours prior to discharge.  Patient denies having homicidal thoughts.  Patient denies having auditory hallucinations.  Patient denies any visual hallucinations or other symptoms of psychosis.  The patient was motivated to continue taking medication with a goal of continued improvement in mental health.   The patient reports their target psychiatric symptoms of  depression and insomnia responded well to the psychiatric medications, and the patient reports overall benefit other  psychiatric hospitalization. Supportive psychotherapy was provided to the patient. The patient also participated in regular group therapy while hospitalized. Coping skills, problem solving as well as relaxation therapies were also part of the unit programming.  Labs were reviewed with the patient, and abnormal results were discussed with the patient.  The patient is able to verbalize their individual safety plan to this provider.  # It is recommended to the patient to continue psychiatric medications as prescribed, after discharge from the hospital.    # It is recommended to the patient to follow up with your outpatient psychiatric provider and PCP.  # It was discussed with the patient, the impact of alcohol, drugs, tobacco have been there overall psychiatric and medical wellbeing, and total abstinence from substance use was recommended the patient.ed.  # Prescriptions provided or sent directly to preferred pharmacy at discharge. Patient agreeable to plan. Given opportunity to ask questions. Appears to feel comfortable with discharge.    # In the event of worsening symptoms, the patient is instructed to call the crisis hotline, 911 and or go to the nearest ED for appropriate evaluation and treatment of symptoms. To follow-up with primary care provider for other medical issues, concerns and or health care needs  # Patient was discharged to parent's home with a plan to follow up as noted below.    On day of discharge Patient reports his mood is "better". He reports sleeping better and having more energy. His appetite is intact. He reports no concerning side effects to his medications. Patient reports improvement in his symptoms of depression and insomnia. Patient denies any current suicidal ideations, homicidal ideations, plans, means, or intent. Patient denies any visual  hallucinations, auditory hallucinations, or paranoia. Patient has plans to return to parent's home. There are no firearms in the home. He continue antidepressant medication regimen. He plans to return to college for the fall semester and re-connect with student counseling services.   Discharge Exam: Blood pressure 128/75, pulse 82, temperature 98.2 F (36.8 C), temperature source Oral, resp. rate 20, height 5\' 1"  (1.549 m), weight 47.8 kg, SpO2 100 %. Psychiatric Specialty Exam: Physical Exam Vitals and nursing note reviewed.  Constitutional:      General: He is not in acute distress.    Appearance: Normal appearance. He is normal weight. He is not ill-appearing, toxic-appearing or diaphoretic.  HENT:     Head: Normocephalic and atraumatic.  Pulmonary:     Effort: Pulmonary effort is normal.  Musculoskeletal:        General: Normal range of motion.  Neurological:     General: No focal deficit present.     Mental Status: He is alert.     Review of Systems  Respiratory:  Negative for cough and shortness of breath.   Cardiovascular:  Negative for chest pain.  Gastrointestinal:  Negative for abdominal pain, constipation, diarrhea, nausea and vomiting.  Neurological:  Negative for dizziness, weakness and headaches.  Psychiatric/Behavioral:  Negative for hallucinations and suicidal ideas. The patient is not nervous/anxious.     Blood pressure 128/75, pulse 82, temperature 98.2 F (36.8 C), temperature source Oral, resp. rate 20, height 5\' 1"  (1.549 m), weight 47.8 kg, SpO2 100 %.Body mass index is 19.92 kg/m.  General Appearance: Casual, Fairly Groomed, and Appropriate for Environment  Eye Contact:  Good  Speech:  Clear and Coherent and Normal Rate  Volume:  Normal  Mood:  Euthymic  Affect:  Appropriate, Congruent, and Full Range  Thought Process:  Linear  Orientation:  Full (Time, Place, and Person)  Thought Content:  Logical  Suicidal Thoughts:  No  Homicidal Thoughts:  No   Memory:  Immediate;   Good Recent;   Good Remote;   Good  Judgement:  Good  Insight:  Good  Psychomotor Activity:  Normal  Concentration:  Concentration: Good  Recall:  Good  Fund of Knowledge:  Good  Language:  Good  Akathisia:  Negative  Handed:  Right  AIMS (if indicated):     Assets:  Communication Skills  ADL's:  Intact  Cognition:  WNL  Sleep:       Disposition: Discharge disposition: 01-Home or Self Care       Discharge Instructions     Diet - low sodium heart healthy   Complete by: As directed    Increase activity slowly   Complete by: As directed       Allergies as of 02/27/2023       Reactions   Bupropion Other (See Comments)   seizure        Medication List     STOP taking these medications    diclofenac 75 MG EC tablet Commonly known as: VOLTAREN   FLUoxetine 20 MG capsule Commonly known as: PROZAC   levETIRAcetam 500 MG tablet Commonly known as: KEPPRA   Valtoco 15 MG Dose 2 x 7.5 MG/0.1ML Lqpk Generic drug: diazePAM (15 MG Dose)       TAKE these medications    acetaminophen 325 MG tablet Commonly known as: TYLENOL Take 2 tablets (650 mg total) by mouth every 6 (six) hours as needed for mild pain. What changed:  medication strength how much to take when to take this reasons to take this   calcium-vitamin D 500-5 MG-MCG tablet Commonly known as: OSCAL WITH D Take 1 tablet by mouth daily with breakfast.   celecoxib 100 MG capsule Commonly known as: CELEBREX Take 1 capsule (100 mg total) by mouth daily for 14 days. Start taking on: February 28, 2023   cholecalciferol 25 MCG (1000 UNIT) tablet Commonly known as: VITAMIN D3 Take 1,000 Units by mouth 2 (two) times daily.   hydrOXYzine 25 MG tablet Commonly known as: ATARAX Take 1 tablet (25 mg total) by mouth 3 (three) times daily as needed for anxiety. What changed:  how much to take when to take this reasons to take this additional instructions   melatonin 5 MG  Tabs Take 1 tablet (5 mg total) by mouth at bedtime.   traZODone 150 MG tablet Commonly known as: DESYREL Take 1 tablet (150 mg total) by mouth at bedtime.   venlafaxine XR 150 MG 24 hr capsule Commonly known as: EFFEXOR-XR Take 1 capsule (150 mg total) by mouth daily with breakfast. Start taking on: February 28, 2023        Follow-up Information     [facility]. Go to.   Specialty: Behavioral Health Why: For fastest service, please go to this provider at 7:00 am, Monday through Friday, to obtain therapy services. You may also call to schedule an appointment, however, the appointments are out to end of August and beyond. Contact information: [address] [place] [address] [phone]        [facility] Go on 03/15/2023.   Why: You have an appointment for medication management services on  03/15/23 at 3:00 pm.   This appointment will be held in person. Contact information: [address] [place] [address] [phone]  Discharge Recommendations: Activity: as tolerated   Diet: heart healthy   Other: -Follow-up with your outpatient psychiatric provider -instructions on appointment date, time, and address (location) are provided to you in discharge paperwork.   -Take your psychiatric medications as prescribed at discharge - instructions are provided to you in the discharge paperwork   -Follow-up with outpatient primary care doctor and other specialists -for management of chronic medical disease, including: Chronic Back Pain   -Testing: Follow-up with outpatient provider for abnormal lab results: Slightly Elevated LDL   -Recommend abstinence from alcohol, tobacco, and other illicit drug use at discharge.    -If your psychiatric symptoms recur, worsen, or if you have side effects to your psychiatric medications, call your outpatient psychiatric provider, 911, [phone] or go to the nearest  emergency department.   -If suicidal thoughts recur, call your outpatient psychiatric provider, 911, [phone] or go to the nearest emergency department.   Signed: Oletha Cruel, Medical student 02/27/2023, 10:28 AM

## 2023-02-27 NOTE — BHH Group Notes (Signed)
Adult Psychoeducational Group Note  Date:  02/27/2023 Time:  11:23 AM  Group Topic/Focus:  Goals Group:   The focus of this group is to help patients establish daily goals to achieve during treatment and discuss how the patient can incorporate goal setting into their daily lives to aide in recovery.  Participation Level:  Active  Participation Quality:  Appropriate  Affect:  Appropriate  Cognitive:  Appropriate  Insight: Good  Engagement in Group:  Engaged  Modes of Intervention:  Discussion  Additional Comments:    Lucilla Edin 02/27/2023, 11:23 AM

## 2023-02-28 LAB — HEMOGLOBIN A1C
Hgb A1c MFr Bld: 5.5 % (ref 4.8–5.6)
Mean Plasma Glucose: 111 mg/dL

## 2023-04-27 IMAGING — CT CT HEAD W/O CM
3 series · 15 of 47 positions shown, 18 images · non-contrast
Comparison: Images of previous study done on 05/23/2018 are not
available for comparison at the time of this report. Report for the
previous study was reviewed.

CLINICAL DATA: Seizures



[Series 2: head wo · axial · 0.47mm/px · z∈[-170,-40]mm · 9 of 32 slices shown, 12 images]
[im 3/32  brain]
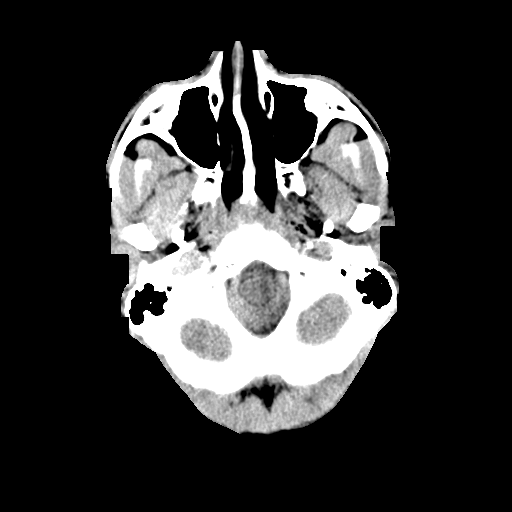
[im 3/32  bone]
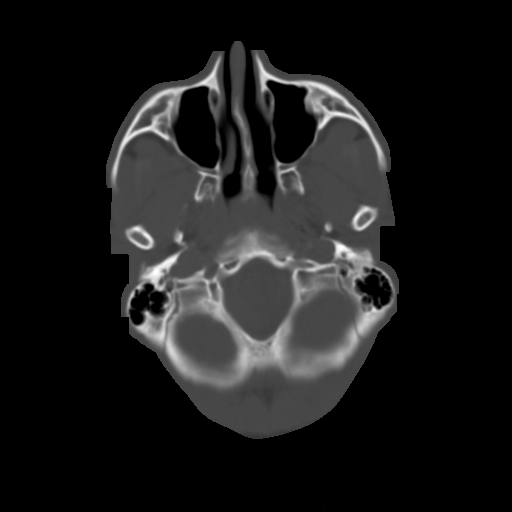
[im 6/32  brain]
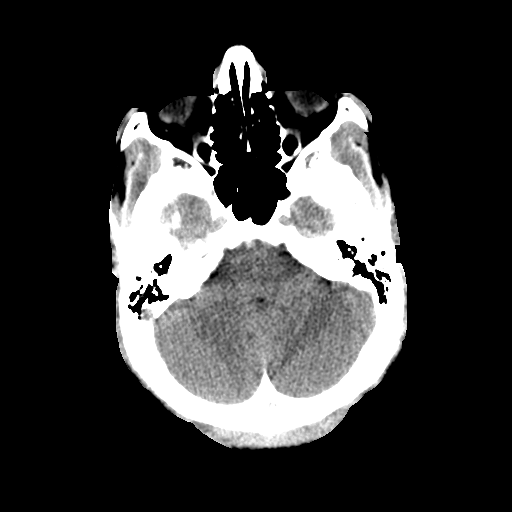
[im 9/32  brain]
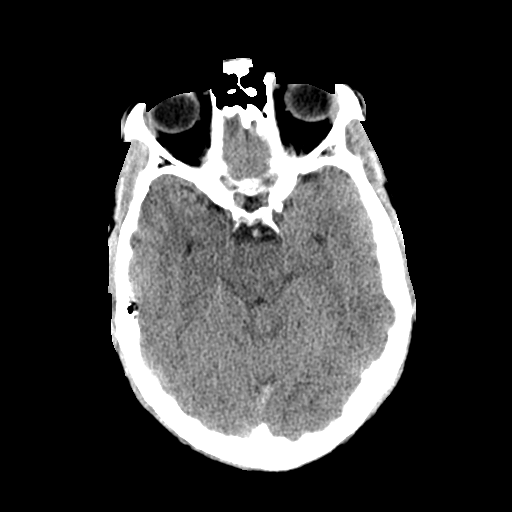
[im 12/32  brain]
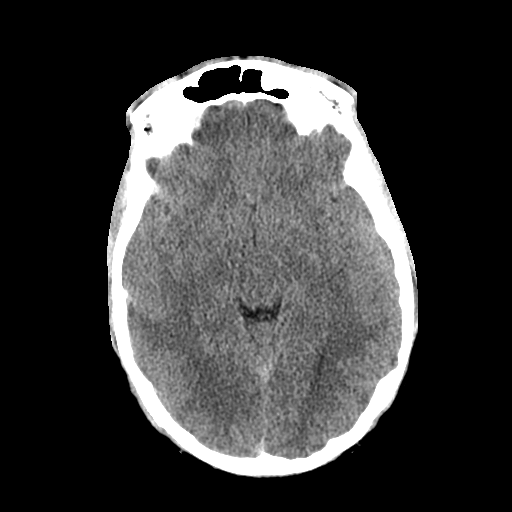
[im 17/32  brain]
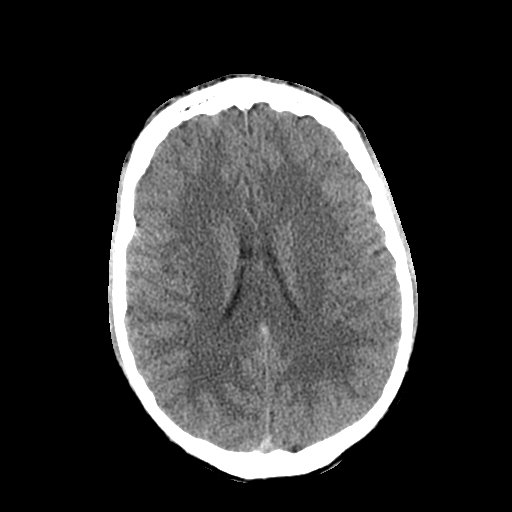
[im 17/32  bone]
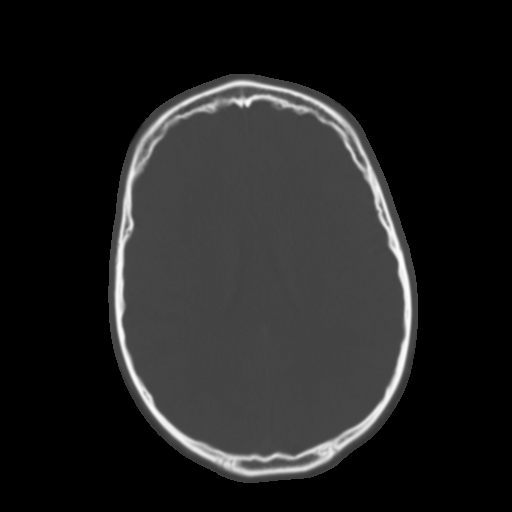
[im 20/32  brain]
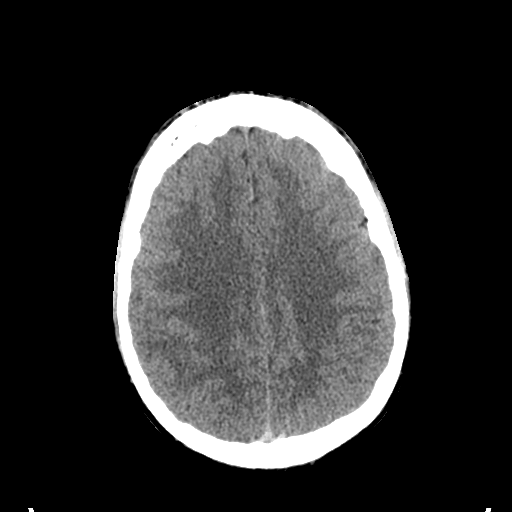
[im 23/32  brain]
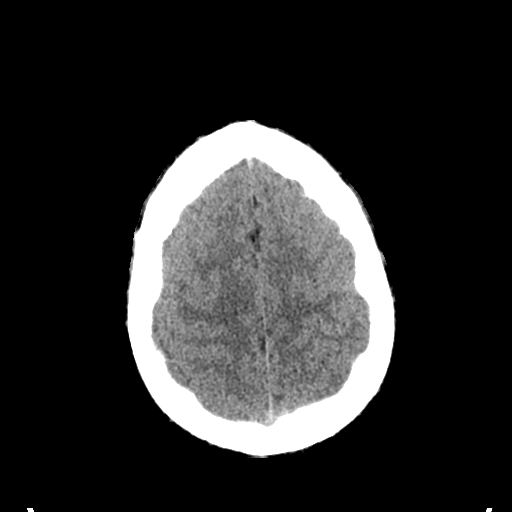
[im 26/32  brain]
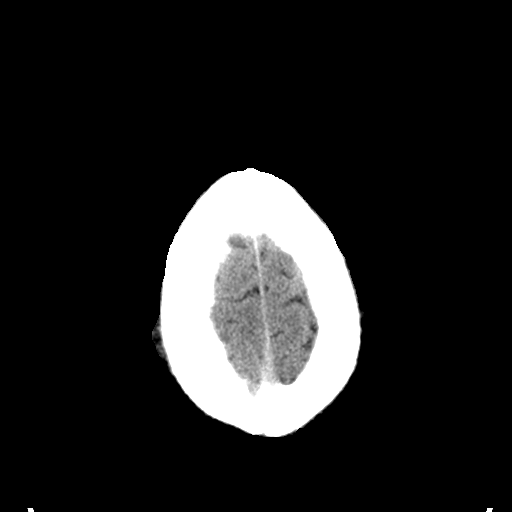
[im 29/32  brain]
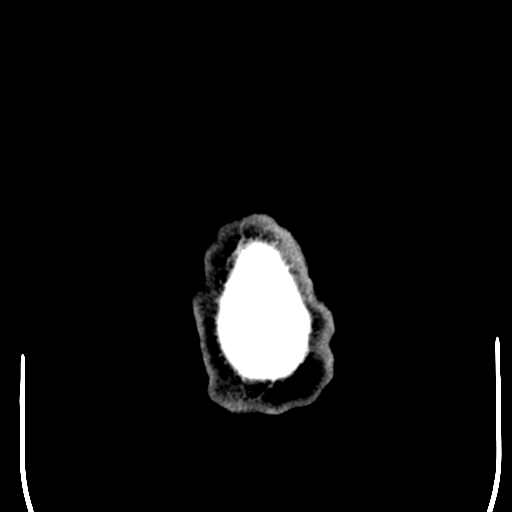
[im 29/32  bone]
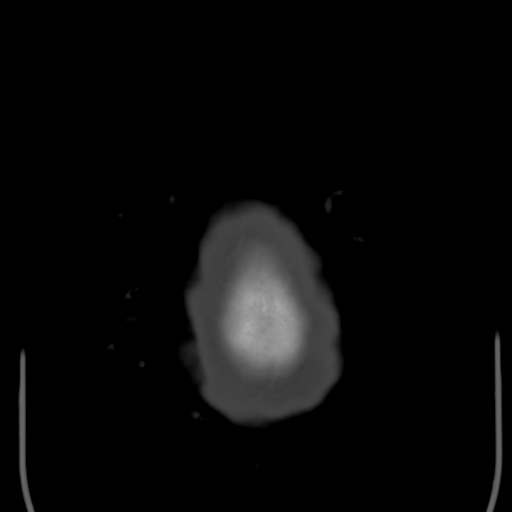

[Series 5: coronal soft tissue · coronal · 0.31mm/px · 3 of 72 slices shown]
[im 24/72  brain]
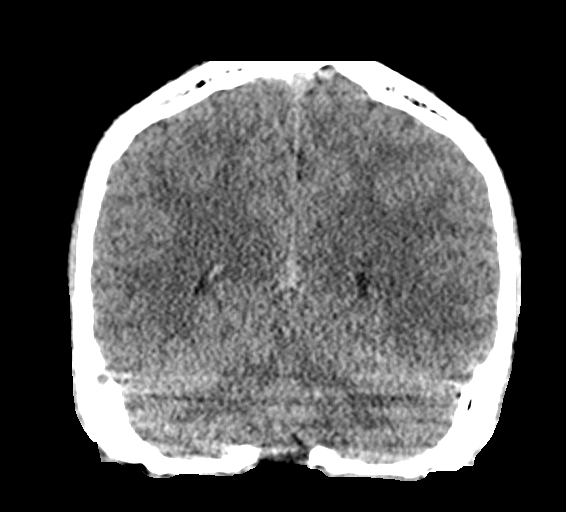
[im 32/72  brain]
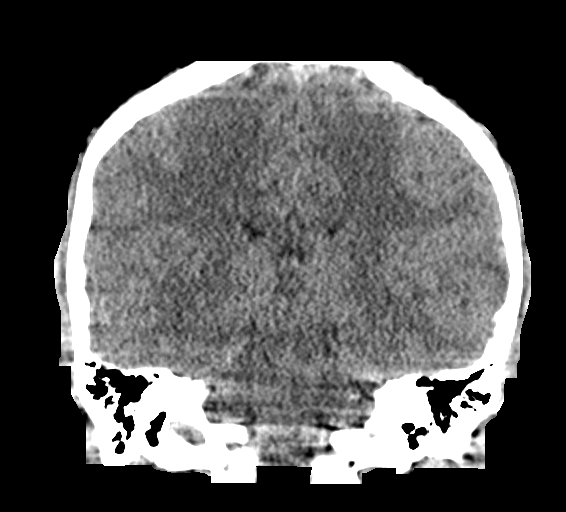
[im 40/72  brain]
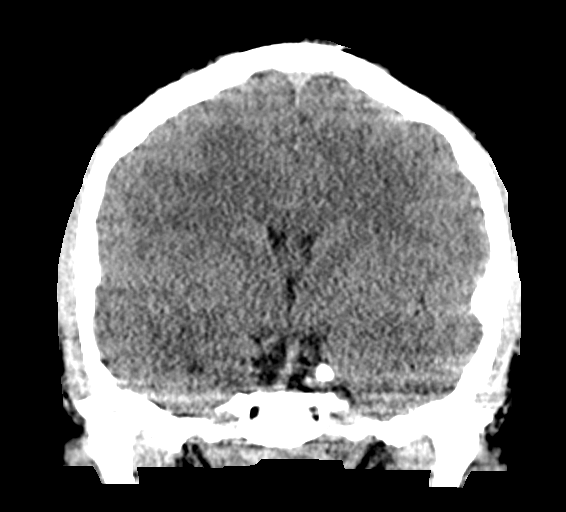

[Series 6: sagittal soft tissue · sagittal · 0.32mm/px · 3 of 57 slices shown]
[im 19/57  brain]
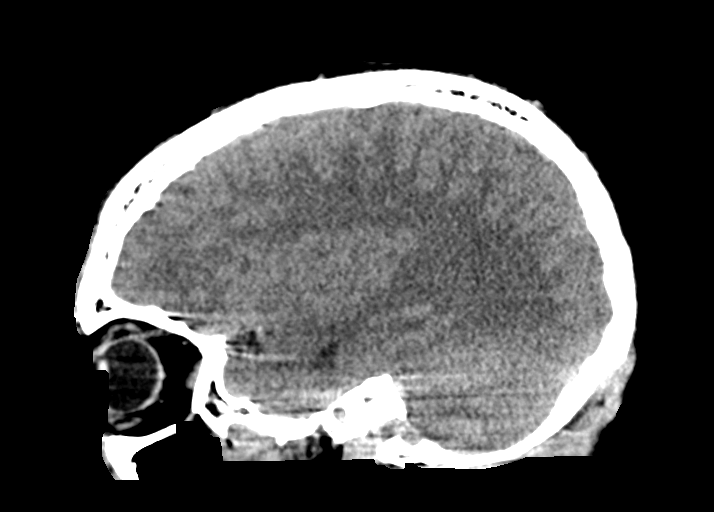
[im 29/57  brain]
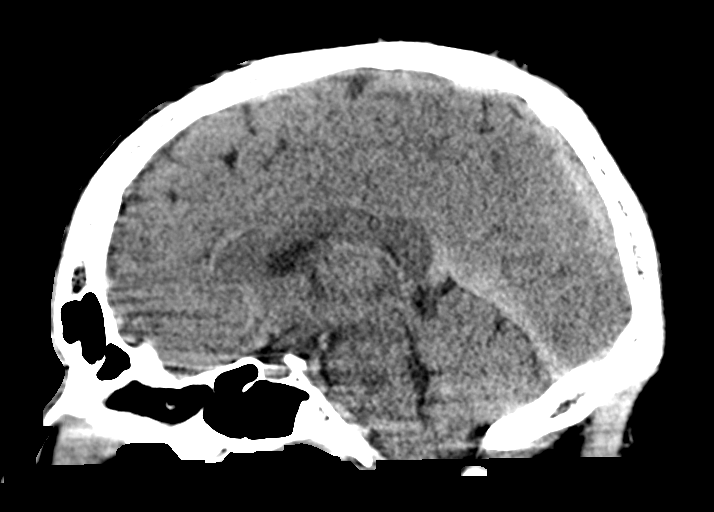
[im 38/57  brain]
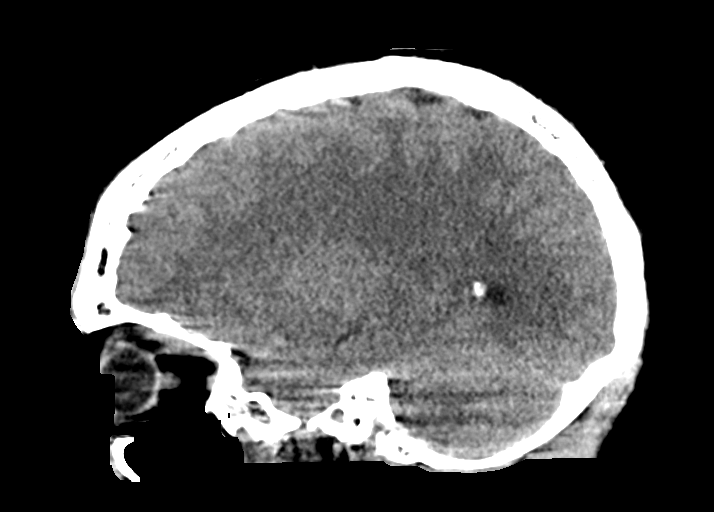

[15 of 47 positions shown; findings below may reference images not displayed]

FINDINGS: Brain: No acute intracranial findings are seen. Ventricles are not
dilated. There is no shift of midline structures. There are no
epidural or subdural fluid collections.

Vascular: Unremarkable.

Skull: Unremarkable.

Sinuses/Orbits: There is mild mucosal thickening in the ethmoid
sinus.

Other: None
IMPRESSION: No acute intracranial findings are seen in noncontrast CT brain.

## 2023-04-27 IMAGING — MR MR HEAD W/O CM
7 series · 48 of 48 positions shown · non-contrast
Comparison: None.

CLINICAL DATA: Seizure, new-onset, no history of trauma

EXAM:
MRI HEAD WITHOUT CONTRAST
TECHNIQUE: Multiplanar, multiecho pulse sequences of the brain and surrounding
structures were obtained without intravenous contrast.

[Series 5: dwi_tracew · axial · 3.0mm · 1.08mm/px · z∈[-60,+90]mm · 14 of 102 slices shown]
[im 1/102]
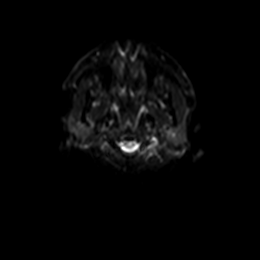
[im 8/102]
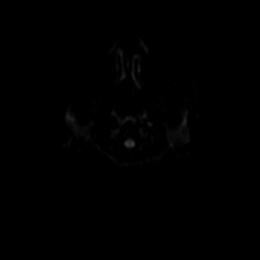
[im 16/102]
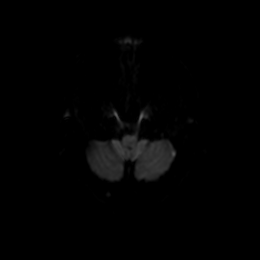
[im 24/102]
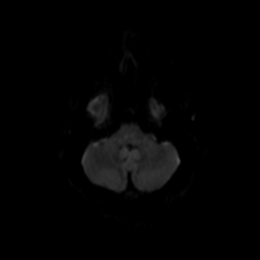
[im 32/102]
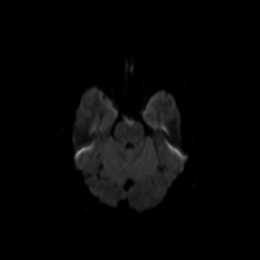
[im 39/102]
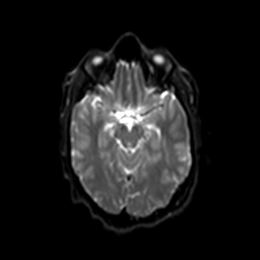
[im 47/102]
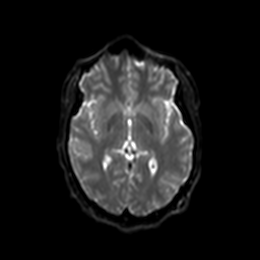
[im 55/102]
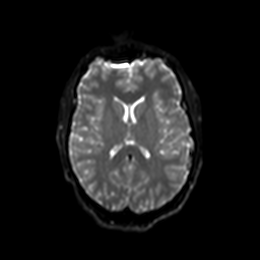
[im 63/102]
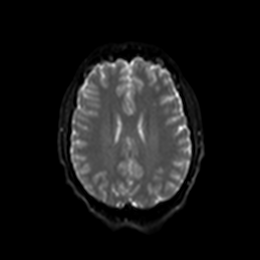
[im 70/102]
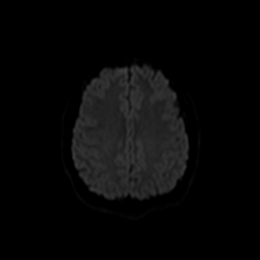
[im 78/102]
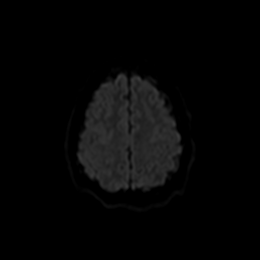
[im 86/102]
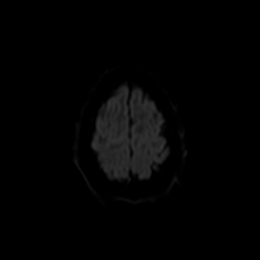
[im 94/102]
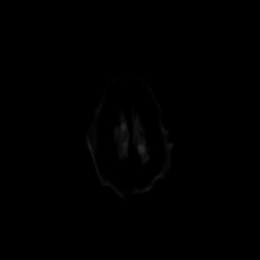
[im 102/102]
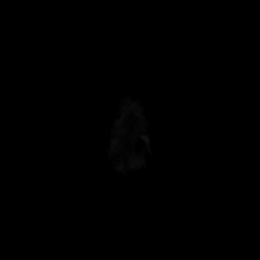

[Series 6: dwi_adc · axial · 3.0mm · 1.08mm/px · z∈[-60,+90]mm · 7 of 51 slices shown]
[im 1/51]
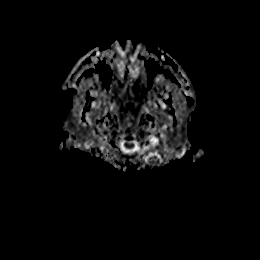
[im 9/51]
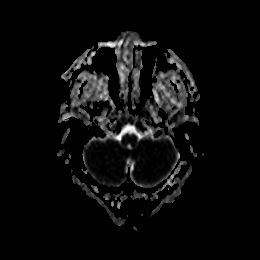
[im 17/51]
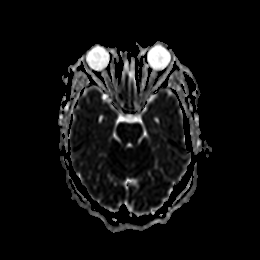
[im 26/51]
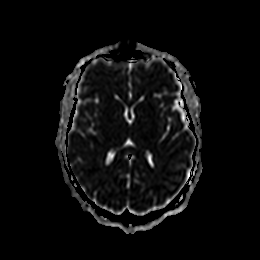
[im 34/51]
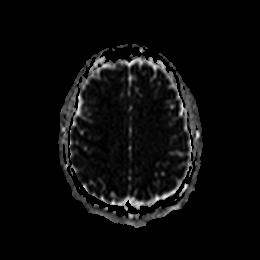
[im 42/51]
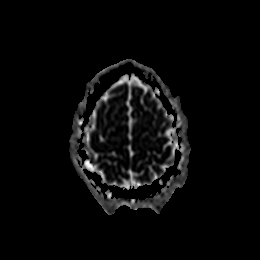
[im 51/51]
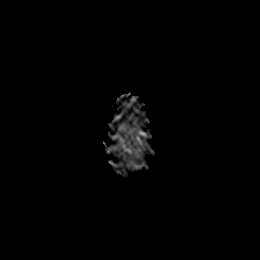

[Series 7: T1 · sagittal · 5.0mm · 0.75mm/px · 4 of 24 slices shown]
[im 1/24]
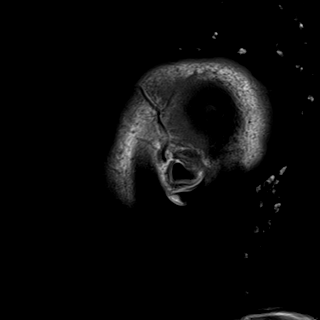
[im 8/24]
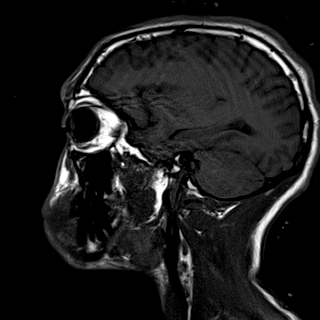
[im 16/24]
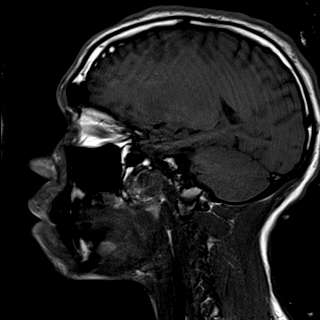
[im 24/24]
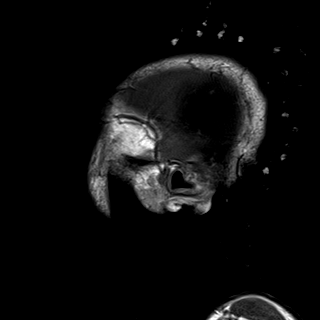

[Series 8: FLAIR · axial · 3.0mm · 0.86mm/px · z∈[-62,+88]mm · 7 of 51 slices shown]
[im 1/51]
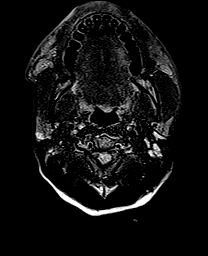
[im 9/51]
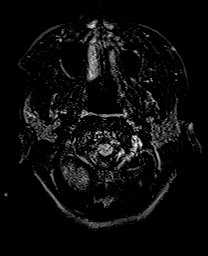
[im 17/51]
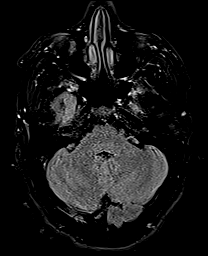
[im 26/51]
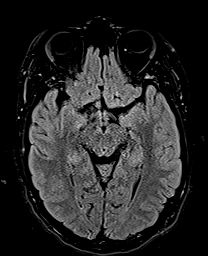
[im 34/51]
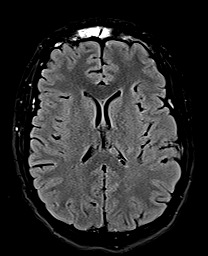
[im 42/51]
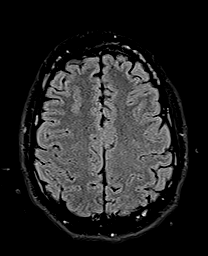
[im 51/51]
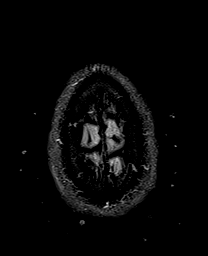

[Series 9: swi_images · axial · 3.0mm · 0.75mm/px · z∈[-57,+96]mm · 8 of 52 slices shown]
[im 1/52]
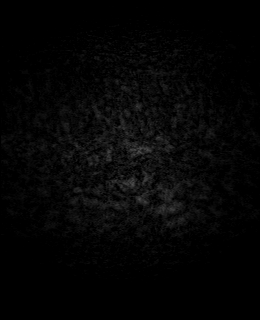
[im 8/52]
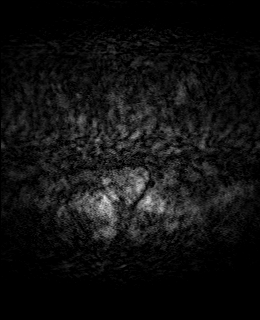
[im 15/52]
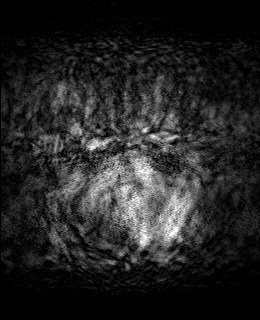
[im 22/52]
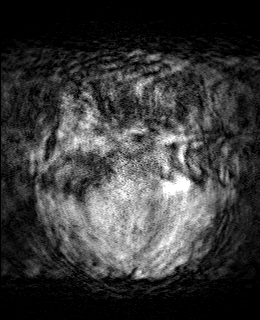
[im 30/52]
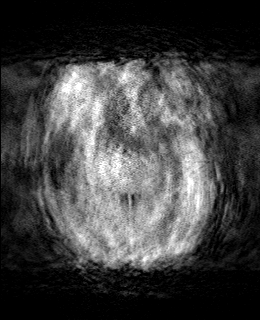
[im 37/52]
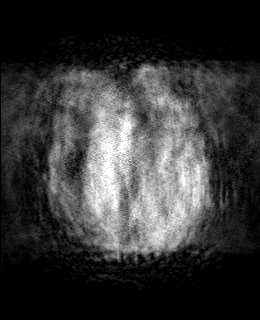
[im 44/52]
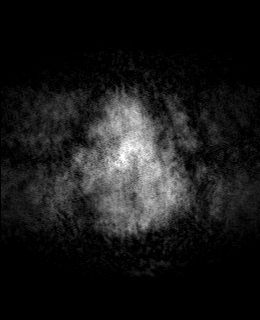
[im 52/52]
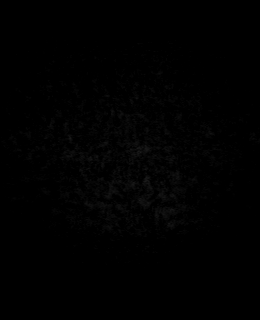

[Series 10: mip_images(sw) · axial · 24.0mm · 0.75mm/px · z∈[-46,+86]mm · 7 of 45 slices shown]
[im 1/45]
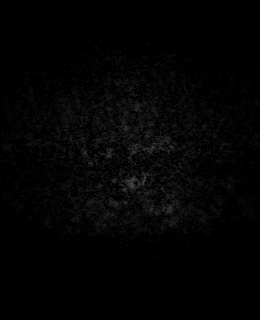
[im 8/45]
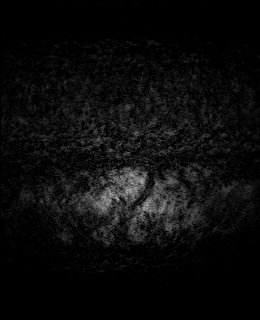
[im 15/45]
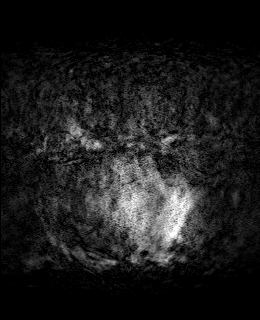
[im 23/45]
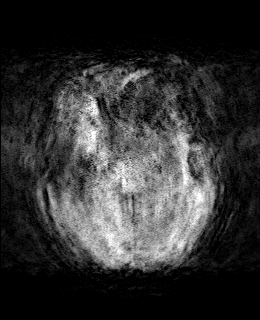
[im 30/45]
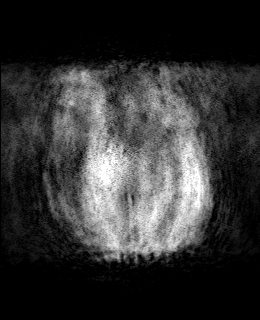
[im 37/45]
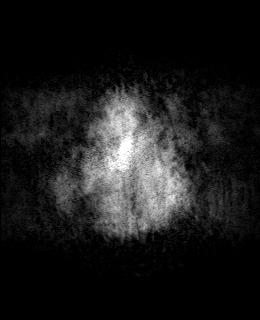
[im 45/45]
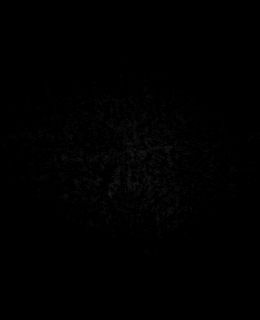

[Series 11: T2 · axial · 5.0mm · 0.45mm/px · 1 of 2 slices shown]
[im 1/2]
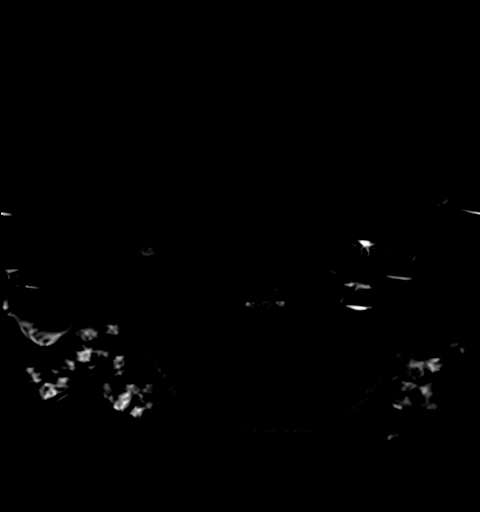

[48 of 48 positions shown; findings below may reference images not displayed]

FINDINGS: DWI, axial T2 FLAIR, and sagittal T1 sequences were obtained.
Patient could not tolerate remainder of the study.

There is no abnormal diffusion signal. Ventricles and sulci are
normal in size and configuration. No parenchymal signal abnormality
on T2 FLAIR. Normal marrow signal is preserved.
IMPRESSION: Obtained sequences demonstrate no acute infarction, mass, or edema.
Patient could not tolerate full study.

## 2023-04-28 ENCOUNTER — Other Ambulatory Visit: Payer: Self-pay

## 2023-04-28 ENCOUNTER — Encounter (HOSPITAL_COMMUNITY): Payer: Self-pay | Admitting: Emergency Medicine

## 2023-04-28 ENCOUNTER — Emergency Department (HOSPITAL_COMMUNITY)
Admission: EM | Admit: 2023-04-28 | Discharge: 2023-04-28 | Disposition: A | Discharge status: Home / Self Care | Payer: Medicaid Other | Attending: Emergency Medicine | Admitting: Emergency Medicine

## 2023-04-28 DIAGNOSIS — U071 COVID-19: Secondary | ICD-10-CM | POA: Insufficient documentation

## 2023-04-28 DIAGNOSIS — R519 Headache, unspecified: Secondary | ICD-10-CM | POA: Diagnosis present

## 2023-04-28 LAB — SARS CORONAVIRUS 2 BY RT PCR: SARS Coronavirus 2 by RT PCR: POSITIVE — AB

## 2023-04-28 MED ORDER — NAPROXEN 500 MG PO TABS: 500.0000 mg | ORAL_TABLET | Freq: Two times a day (BID) | ORAL | 0 refills | Status: AC

## 2023-04-28 MED ORDER — KETOROLAC TROMETHAMINE 60 MG/2ML IM SOLN
15.0000 mg | Freq: Once | INTRAMUSCULAR | Status: DC
  Filled 2023-04-28: qty 2

## 2023-04-28 NOTE — Discharge Instructions (Addendum)
You tested positive for COVID today. Please stay hydrated. Take tylenol/ibuprofen or naproxen for pain.  You can also try DayQuil/Nyquil or Theraflu for symptom relief. I recommend close follow-up with PCP for reevaluation.  Please do not hesitate to return to emergency department if worrisome signs symptoms we discussed become apparent.

## 2023-04-28 NOTE — ED Triage Notes (Signed)
Pt reports headache x 3 days. Pt also reports chills and fevers at home. Pt reports taking tylenol at home.

## 2023-04-28 NOTE — ED Provider Notes (Signed)
[facility] Provider Note   CSN: [id number] Arrival date & time: 04/28/23  1802     History  Chief Complaint  Patient presents with   Headache    Sean Calderon is a 22 y.o. male otherwise healthy presents today for evaluation of headache and COVID test.  Patient reports that he has had a headache in the last 3 days.  Pain is on the right side of his head, constant, nonradiating.  No history of migraines.  Denies photophobia. Denies any neck pain.  States that his mom recently tested positive for COVID and he thinks that he might have had exposure to COVID.  He denies any cough, runny nose, chest pain, shortness of breath, nausea, vomiting, vision changes.  Patient reports he has tried Tylenol at home with minimal relief.   Headache   Past Medical History:  Diagnosis Date   ADHD (attention deficit hyperactivity disorder)    Mental disorder    Psychosis (HCC)    Past Surgical History:  Procedure Laterality Date   WISDOM TOOTH EXTRACTION       Home Medications Prior to Admission medications   Medication Sig Start Date End Date Taking? Authorizing Provider  acetaminophen (TYLENOL) 325 MG tablet Take 2 tablets (650 mg total) by mouth every 6 (six) hours as needed for mild pain. 02/27/23   Massengill, Harrold Donath, MD  calcium-vitamin D Ruthell Rummage WITH D) 500-5 MG-MCG tablet Take 1 tablet by mouth daily with breakfast. 12/20/21   Lewie Chamber, MD  cholecalciferol (VITAMIN D3) 25 MCG (1000 UNIT) tablet Take 1,000 Units by mouth 2 (two) times daily.    [provider]  hydrOXYzine (ATARAX) 25 MG tablet Take 1 tablet (25 mg total) by mouth 3 (three) times daily as needed for anxiety. 02/27/23   Massengill, Harrold Donath, MD  melatonin 5 MG TABS Take 1 tablet (5 mg total) by mouth at bedtime. 02/27/23   Massengill, Harrold Donath, MD  traZODone (DESYREL) 150 MG tablet Take 1 tablet (150 mg total) by mouth at bedtime. 02/27/23 03/29/23  Phineas Inches, MD   venlafaxine XR (EFFEXOR-XR) 150 MG 24 hr capsule Take 1 capsule (150 mg total) by mouth daily with breakfast. 02/28/23 03/30/23  Phineas Inches, MD      Allergies    Bupropion    Review of Systems   Review of Systems  Neurological:  Positive for headaches.    Physical Exam Updated Vital Signs BP 110/75 (BP Location: Left Arm)   Pulse 68   Temp 98.7 F (37.1 C) (Oral)   Resp 18   Ht 5\' 3"  (1.6 m)   Wt 49.9 kg   SpO2 100%   BMI 19.49 kg/m  Physical Exam Vitals and nursing note reviewed.  Constitutional:      Appearance: Normal appearance.  HENT:     Head: Normocephalic and atraumatic.     Mouth/Throat:     Mouth: Mucous membranes are moist.  Eyes:     General: No scleral icterus. Cardiovascular:     Rate and Rhythm: Normal rate and regular rhythm.     Pulses: Normal pulses.     Heart sounds: Normal heart sounds.  Pulmonary:     Effort: Pulmonary effort is normal.     Breath sounds: Normal breath sounds.  Abdominal:     General: Abdomen is flat.     Palpations: Abdomen is soft.     Tenderness: There is no abdominal tenderness.  Musculoskeletal:  General: No deformity.  Skin:    General: Skin is warm.     Findings: No rash.  Neurological:     General: No focal deficit present.     Mental Status: He is alert.     Comments: Cranial nerves II through XII intact. Intact sensation to light touch in all 4 extremities. 5/5 strength in all 4 extremities. Intact finger-to-nose and heel-to-shin of all 4 extremities. No visual field cuts. No neglect noted. No aphasia noted.  Psychiatric:        Mood and Affect: Mood normal.     ED Results / Procedures / Treatments   Labs (all labs ordered are listed, but only abnormal results are displayed) Labs Reviewed  SARS CORONAVIRUS 2 BY RT PCR    EKG None  Radiology No results found.  Procedures Procedures    Medications Ordered in ED Medications - No data to display  ED Course/ Medical Decision Making/  A&P                                 Medical Decision Making Risk Prescription drug management.   This patient presents to the ED for headache, body aches, this involves an extensive number of treatment options, and is a complaint that carries with a high risk of complications and morbidity.  The differential diagnosis includes flu, COVID, RSV, strep, pharyngitis, bronchitis, pneumonia, infectious etiology.  This is not an exhaustive list.  Lab tests: I ordered and personally interpreted labs.  The pertinent results include: Viral panel positive for COVID.  Imaging studies:  Problem list/ ED course/ Critical interventions/ Medical management: HPI: See above Vital signs within normal range and stable throughout visit. Laboratory/imaging studies significant for: See above. On physical examination, patient is afebrile and appears in no acute distress. This patient presents with symptoms suspicious for COVID. Based on history and physical doubt sinusitis. COVID test was positive. Do not suspect underlying cardiopulmonary process. I considered, but think unlikely, pneumonia. Patient is nontoxic appearing and not in need of emergent medical intervention. Patient told to self isolate at home until symptoms subside for 72 hours. Recommended patient to take TheraFlu or Mucinex for symptom relief.  Follow-up with primary care physician for further evaluation and management.  Return to the ER if new or worsening symptoms. I have reviewed the patient home medicines and have made adjustments as needed.  Cardiac monitoring/EKG: The patient was maintained on a cardiac monitor.  I personally reviewed and interpreted the cardiac monitor which showed an underlying rhythm of: sinus rhythm.  Additional history obtained: External records from outside source obtained and reviewed including: Chart review including previous notes, labs, imaging.  Consultations obtained:  Disposition Continued outpatient  therapy. Follow-up with PCP recommended for reevaluation of symptoms. Treatment plan discussed with patient.  Pt acknowledged understanding was agreeable to the plan. Worrisome signs and symptoms were discussed with patient, and patient acknowledged understanding to return to the ED if they noticed these signs and symptoms. Patient was stable upon discharge.   This chart was dictated using voice recognition software.  Despite best efforts to proofread,  errors can occur which can change the documentation meaning.          Final Clinical Impression(s) / ED Diagnoses Final diagnoses:  COVID-19    Rx / DC Orders ED Discharge Orders     None         Jeanelle Malling, [place] 04/28/23 2043  Gwyneth Sprout, MD 04/28/23 [phone]

## 2023-07-28 ENCOUNTER — Other Ambulatory Visit: Payer: Self-pay

## 2023-07-28 ENCOUNTER — Ambulatory Visit
Admission: EM | Admit: 2023-07-28 | Discharge: 2023-07-28 | Disposition: A | Discharge status: Home / Self Care | Payer: Medicaid Other

## 2023-07-28 ENCOUNTER — Encounter: Payer: Self-pay | Admitting: Emergency Medicine

## 2023-07-28 DIAGNOSIS — L03012 Cellulitis of left finger: Secondary | ICD-10-CM

## 2023-07-28 DIAGNOSIS — J309 Allergic rhinitis, unspecified: Secondary | ICD-10-CM | POA: Insufficient documentation

## 2023-07-28 MED ORDER — DOXYCYCLINE HYCLATE 100 MG PO CAPS: 100.0000 mg | ORAL_CAPSULE | Freq: Two times a day (BID) | ORAL | 0 refills | Status: AC

## 2023-07-28 NOTE — ED Triage Notes (Signed)
Pt reports swelling to L index finger x4 days. Pt reports taking nail scissors to cut around the area and found pocket of blood underneath. Pt reports some blood drained from area earlier today. Pt reports hx of ingrown nails.

## 2023-08-02 ENCOUNTER — Encounter: Payer: Self-pay | Admitting: Physician Assistant

## 2023-08-02 NOTE — ED Provider Notes (Signed)
[facility]    CSN: [id number] Arrival date & time: 07/28/23  1519      History   Chief Complaint Chief Complaint  Patient presents with   Hand Pain    HPI Sean Calderon is a 22 y.o. male.   Patient here today for evaluation of swelling to his left distal index finger that started 4 days ago. He notes symptoms started after he had used nail scissors to cut around his cuticle, and he did have some drainage from area earlier today. He does have history of ingrown nails. He has not had fever. He denies any numbness.   The history is provided by the patient.  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   Suicide ideation 02/21/2023   Localized osteoporosis with current pathological fracture with routine healing 06/22/2022   Vitamin D deficiency 06/22/2022   Major depressive disorder, recurrent, moderate (HCC) 01/14/2022   Seizure (HCC) 12/17/2021   Compression fracture of body of thoracic vertebra (HCC) 12/17/2021   Marijuana abuse 12/17/2021   Frequent headaches 06/19/2018   Generalized anxiety disorder 06/19/2018   Depressed mood 06/19/2018   Vasovagal syncope 06/19/2018   Tension type headache 06/19/2018   Body mass index, pediatric, 85th percentile to less than 95th percentile for age 89/14/2015   ADHD (attention deficit hyperactivity disorder), 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   Psychosis (HCC) 12/21/2012   Educational circumstance 07/24/2012   Headache 07/24/2012   Epistaxis 01/12/2012   Gingivitis 01/12/2012    Past Surgical History:  Procedure Laterality Date   WISDOM TOOTH EXTRACTION         Home Medications    Prior to Admission medications    Medication Sig Start Date End Date Taking? Authorizing Provider  calcium-vitamin D (OSCAL WITH D) 500-5 MG-MCG tablet Take 1 tablet by mouth daily with breakfast. 12/20/21  Yes Lewie Chamber, MD  cholecalciferol (VITAMIN D3) 25 MCG (1000 UNIT) tablet Take 1,000 Units by mouth 2 (two) times daily.   Yes [provider]  doxycycline (VIBRAMYCIN) 100 MG capsule Take 1 capsule (100 mg total) by mouth 2 (two) times daily for 7 days. 07/28/23 08/04/23 Yes Tomi Bamberger, PA-C  hydrOXYzine (ATARAX) 25 MG tablet Take 1 tablet (25 mg total) by mouth 3 (three) times daily as needed for anxiety. 02/27/23  Yes Massengill, Harrold Donath, MD  methocarbamol (ROBAXIN) 500 MG tablet Take 500 mg by mouth 4 (four) times daily.   Yes [provider]  traZODone (DESYREL) 150 MG tablet Take 1 tablet (150 mg total) by mouth at bedtime. 02/27/23 07/28/23 Yes Massengill, Harrold Donath, MD  venlafaxine XR (EFFEXOR-XR) 150 MG 24 hr capsule Take 1 capsule (150 mg total) by mouth daily with breakfast. 02/28/23 07/28/23 Yes Massengill, Harrold Donath, MD  acetaminophen (TYLENOL) 325 MG tablet Take 2 tablets (650 mg total) by mouth every 6 (six) hours as needed for mild pain. 02/27/23   Massengill, Harrold Donath, MD  melatonin 5 MG TABS Take 1 tablet (5 mg total) by mouth at bedtime. Patient not taking: Reported on 07/28/2023 02/27/23   Phineas Inches, MD  meloxicam (MOBIC) 7.5 MG tablet Take 7.5 mg by mouth daily. Patient not taking: Reported on 07/28/2023 06/12/23 08/11/23  [provider]  naproxen (NAPROSYN) 500 MG tablet Take 1 tablet (500 mg total) by mouth 2 (two) times daily. Patient not taking: Reported on 07/28/2023 04/28/23   Jeanelle Malling, PA  polyethylene glycol powder (GLYCOLAX/MIRALAX) 17 GM/SCOOP powder Take 1 Container by mouth daily. Patient not taking: Reported on 07/28/2023 05/19/23 05/18/24  [provider]  RETIN-A 0.025 % cream Apply 1 Application topically at bedtime. Patient not taking: Reported on 07/28/2023 02/28/23    [provider]    Family History Family History  Problem Relation Age of Onset   Migraines Brother    Seizures Neg Hx    Depression Neg Hx    Anxiety disorder Neg Hx    ADD / ADHD Neg Hx    Autism Neg Hx     Social History Social History   Tobacco Use   Smoking status: Never   Smokeless tobacco: Never  Vaping Use   Vaping status: Former   Devices: cannibus vape cartridges  Substance Use Topics   Alcohol use: No   Drug use: Not Currently    Types: Marijuana    Comment: Previously, using once weekly. None since hospital on 12/17/21.     Allergies   Bupropion   Review of Systems Review of Systems  Constitutional:  Negative for chills and fever.  Eyes:  Negative for discharge and redness.  Skin:  Positive for color change. Negative for wound.  Neurological:  Negative for numbness.     Physical Exam Triage Vital Signs ED Triage Vitals [07/28/23 1618]  Encounter Vitals Group     BP 104/66     Systolic BP Percentile      Diastolic BP Percentile      Pulse Rate 67     Resp 16     Temp 98 F (36.7 C)     Temp Source Oral     SpO2 98 %     Weight      Height      Head Circumference      Peak Flow      Pain Score 5     Pain Loc      Pain Education      Exclude from Growth Chart    No data found.  Updated Vital Signs BP 104/66 (BP Location: Left Arm)   Pulse 67   Temp 98 F (36.7 C) (Oral)   Resp 16   SpO2 98%   Visual Acuity Right Eye Distance:   Left Eye Distance:   Bilateral Distance:    Right Eye Near:   Left Eye Near:    Bilateral Near:     Physical Exam Vitals and nursing note reviewed.  Constitutional:      General: He is not in acute distress.    Appearance: Normal appearance. He is not ill-appearing.  HENT:     Head: Normocephalic and atraumatic.  Eyes:     Conjunctiva/sclera: Conjunctivae normal.  Cardiovascular:     Rate and Rhythm: Normal rate.  Pulmonary:     Effort: Pulmonary effort is normal. No respiratory  distress.  Skin:    Capillary Refill: Normal cap refill to left index finger    Comments: Mild swelling and erythema to distal left index finger with purulent fluid collection noted at cuticle  Neurological:     Mental Status: He is alert.     Comments: Gross sensation intact to distal left index finger  Psychiatric:        Mood and Affect: Mood  normal.        Behavior: Behavior normal.        Thought Content: Thought content normal.      UC Treatments / Results  Labs (all labs ordered are listed, but only abnormal results are displayed) Labs Reviewed - No data to display  EKG   Radiology No results found.  Procedures Procedures (including critical care time)  Medications Ordered in UC Medications - No data to display  Initial Impression / Assessment and Plan / UC Course  I have reviewed the triage vital signs and the nursing notes.  Pertinent labs & imaging results that were available during my care of the patient were reviewed by me and considered in my medical decision making (see chart for details).    No incision and drainage indicated given spontaneous drainage. Advised warm soaks and will treat with doxycycline. Encouraged follow up if no gradual improvement or with any worsening symptoms.  Final Clinical Impressions(s) / UC Diagnoses   Final diagnoses:  Paronychia of finger of left hand   Discharge Instructions   None    ED Prescriptions     Medication Sig Dispense Auth. Provider   doxycycline (VIBRAMYCIN) 100 MG capsule Take 1 capsule (100 mg total) by mouth 2 (two) times daily for 7 days. 14 capsule Tomi Bamberger, PA-C      PDMP not reviewed this encounter.   Tomi Bamberger, PA-C 08/02/23 [phone]

## 2023-10-11 ENCOUNTER — Other Ambulatory Visit: Payer: Self-pay | Admitting: Pediatrics

## 2023-10-11 ENCOUNTER — Other Ambulatory Visit: Payer: Self-pay | Admitting: Internal Medicine

## 2023-10-11 DIAGNOSIS — M8080XD Other osteoporosis with current pathological fracture, unspecified site, subsequent encounter for fracture with routine healing: Secondary | ICD-10-CM

## 2023-10-11 DIAGNOSIS — E559 Vitamin D deficiency, unspecified: Secondary | ICD-10-CM

## 2023-10-11 DIAGNOSIS — S22000A Wedge compression fracture of unspecified thoracic vertebra, initial encounter for closed fracture: Secondary | ICD-10-CM

## 2023-12-04 ENCOUNTER — Ambulatory Visit: Admission: EM | Admit: 2023-12-04 | Discharge: 2023-12-04 | Disposition: A | Discharge status: Home / Self Care

## 2023-12-04 ENCOUNTER — Encounter: Payer: Self-pay | Admitting: Emergency Medicine

## 2023-12-04 DIAGNOSIS — T887XXA Unspecified adverse effect of drug or medicament, initial encounter: Secondary | ICD-10-CM

## 2023-12-04 DIAGNOSIS — R110 Nausea: Secondary | ICD-10-CM

## 2023-12-04 DIAGNOSIS — R1084 Generalized abdominal pain: Secondary | ICD-10-CM | POA: Diagnosis not present

## 2023-12-04 MED ORDER — METOCLOPRAMIDE HCL 10 MG PO TABS: 10.0000 mg | ORAL_TABLET | Freq: Three times a day (TID) | ORAL | 0 refills | Status: AC | PRN

## 2023-12-04 MED ORDER — FAMOTIDINE 20 MG PO TABS: 20.0000 mg | ORAL_TABLET | Freq: Two times a day (BID) | ORAL | 0 refills | Status: AC | PRN

## 2023-12-04 NOTE — Discharge Instructions (Addendum)
 Contact your prescriber for your ADHD medications to discuss other options for treatment given the symptoms you have been experiencing since starting this medication a month ago.  Meantime I am placing you on famotidine take 1 tablet prior to breakfast and dinner as needed to help with settling your stomach and prevent you from having the urge to vomit.  For the acute symptoms of nausea Reglan you may take this medication up to 3 times daily as needed for symptoms of nausea and it also can be taken with the famotidine immediately before meals to prevent the sensation of eating to regurgitate after meals.

## 2023-12-04 NOTE — ED Triage Notes (Signed)
 Pt presents with abdominal pain x 3 weeks. Pt also reports feeling nauseous after every meal. Pt believed he was having side effects to ADHD meds with the abdominal pain but says this is a new symptom he is experiencing as far as the nausea.

## 2023-12-04 NOTE — ED Provider Notes (Signed)
 [facility]    CSN: [id number] Arrival date & time: 12/04/23  1336      History   Chief Complaint Chief Complaint  Patient presents with   Abdominal Pain    HPI Sean Calderon is a 23 y.o. male.  Patient with a with ADHD and depression presents today with a 3-week history of abdominal pain.  Patient reports he initially thought symptoms were related to his ADHD medication but now he is experiencing nausea.  Has any changes to bowel habits.  Nausea is mainly happening after meals.  Denies any specific foods that is precipitating symptoms.  This is symptoms of nausea and an abdominal pain most pronounced in the morning time.  He endorses since starting his ADHD medication he has had lack of appetite and reports losing approximately 5 pounds.  He has been treated with Adderall extended release 20 mg daily.  History of GERD or any recurrent problems GI symptoms or problems. 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   Suicide ideation 02/21/2023   Localized osteoporosis with current pathological fracture with routine healing 06/22/2022   Vitamin D deficiency 06/22/2022   Major depressive disorder, recurrent, moderate (HCC) 01/14/2022   Seizure (HCC) 12/17/2021   Compression fracture of body of thoracic vertebra (HCC) 12/17/2021   Marijuana abuse 12/17/2021   Frequent headaches 06/19/2018   Generalized anxiety disorder 06/19/2018   Depressed mood 06/19/2018   Vasovagal syncope 06/19/2018   Tension type headache 06/19/2018   Body mass index, pediatric, 85th percentile to less than 95th percentile for age 54/14/2015   ADHD (attention deficit hyperactivity disorder), combined type  12/24/2012   Psychosis (HCC) 12/21/2012   Educational circumstance 07/24/2012   Headache 07/24/2012   Epistaxis 01/12/2012   Gingivitis 01/12/2012    Past Surgical History:  Procedure Laterality Date   WISDOM TOOTH EXTRACTION         Home Medications    Prior to Admission medications   Medication Sig Start Date End Date Taking? Authorizing Provider  amphetamine-dextroamphetamine (ADDERALL XR) 20 MG 24 hr capsule Take 20 mg by mouth daily. 11/15/23  Yes [provider]  amphetamine-dextroamphetamine (ADDERALL XR) 5 MG 24 hr capsule Take by mouth. 09/12/23  Yes [provider]  chlorhexidine (PERIDEX) 0.12 % solution See Admin Instructions. PLEASE SEE ATTACHED FOR DETAILED DIRECTIONS 09/20/23  Yes [provider]  doxycycline (VIBRAMYCIN) 100 MG capsule Take by mouth. 07/28/23  Yes [provider]  famotidine (PEPCID) 20 MG tablet Take 1 tablet (20 mg total) by mouth 2 (two) times daily as needed for heartburn or indigestion (take before meals as needed). 12/04/23  Yes Bing Neighbors, NP  FLUoxetine (PROZAC) 20 MG capsule Take 1 capsule by mouth daily. 10/17/23  Yes [provider]  hydrOXYzine (ATARAX) 25 MG tablet Take by mouth. 02/20/23  Yes [provider]  ibuprofen (ADVIL) 800 MG tablet Take 800 mg by mouth every 6 (six) hours as needed. 10/26/23  Yes [provider]  meloxicam (MOBIC) 15 MG tablet take 1 tablet (15 mg total) by mouth once a day with a meal. 10/19/23  Yes [provider]  meloxicam (MOBIC) 7.5 MG tablet TAKE 1 TABLET (7.5 MG TOTAL)  BY MOUTH DAILY. TAKE WITH FOOD. DO NOT TAKE WITH ANY OTHER NSAIDS. 07/23/23  Yes [provider]  metoCLOPramide (REGLAN) 10 MG tablet Take 1 tablet (10 mg total) by mouth every 8 (eight) hours as needed for nausea. 12/04/23  Yes Bing Neighbors, NP  acetaminophen (TYLENOL) 325 MG tablet Take 2 tablets (650 mg total) by mouth every 6 (six) hours as needed for mild  pain. 02/27/23   Massengill, Harrold Donath, MD  amphetamine-dextroamphetamine (ADDERALL XR) 10 MG 24 hr capsule Take 10 mg by mouth daily.    [provider]  calcium-vitamin D (Ruthell Rummage WITH D) 500-5 MG-MCG tablet Take 1 tablet by mouth daily with breakfast. 12/20/21   Lewie Chamber, MD  cholecalciferol (VITAMIN D3) 25 MCG (1000 UNIT) tablet Take 1,000 Units by mouth 2 (two) times daily.    [provider]  hydrOXYzine (ATARAX) 25 MG tablet Take 1 tablet (25 mg total) by mouth 3 (three) times daily as needed for anxiety. 02/27/23   Massengill, Harrold Donath, MD  melatonin 5 MG TABS Take 1 tablet (5 mg total) by mouth at bedtime. Patient not taking: Reported on 07/28/2023 02/27/23   Phineas Inches, MD  methocarbamol (ROBAXIN) 500 MG tablet Take 500 mg by mouth 4 (four) times daily.    [provider]  naproxen (NAPROSYN) 500 MG tablet Take 1 tablet (500 mg total) by mouth 2 (two) times daily. Patient not taking: Reported on 07/28/2023 04/28/23   Jeanelle Malling, PA  polyethylene glycol powder (GLYCOLAX/MIRALAX) 17 GM/SCOOP powder Take 1 Container by mouth daily. Patient not taking: Reported on 07/28/2023 05/19/23 05/18/24  [provider]  RETIN-A 0.025 % cream Apply 1 Application topically at bedtime. Patient not taking: Reported on 07/28/2023 02/28/23   [provider]  traZODone (DESYREL) 150 MG tablet Take 1 tablet (150 mg total) by mouth at bedtime. 02/27/23 07/28/23  Massengill, Harrold Donath, MD  venlafaxine XR (EFFEXOR-XR) 150 MG 24 hr capsule Take 1 capsule (150 mg total) by mouth daily with breakfast. 02/28/23 07/28/23  Massengill, Harrold Donath, MD    Family History Family History  Problem Relation Age of Onset   Migraines Brother    Seizures Neg Hx    Depression Neg Hx    Anxiety disorder Neg Hx    ADD / ADHD Neg Hx    Autism Neg Hx     Social History Social History   Tobacco Use   Smoking status: Never   Smokeless tobacco: Never  Vaping Use   Vaping status: Former   Devices:  cannibus vape cartridges  Substance Use Topics   Alcohol use: No   Drug use: Not Currently    Types: Marijuana    Comment: Previously, using once weekly. None since hospital on 12/17/21.     Allergies   Bupropion   Review of Systems Review of Systems  Gastrointestinal:  Positive for abdominal pain.     Physical Exam Triage Vital Signs ED Triage Vitals  Encounter Vitals Group     BP 12/04/23 1352 114/75     Systolic BP Percentile --      Diastolic BP Percentile --      Pulse Rate 12/04/23 1352 79     Resp 12/04/23 1352 16     Temp 12/04/23 1352 98.9 F (37.2 C)     Temp Source 12/04/23 1352 Oral     SpO2 12/04/23 1352 97 %     Weight 12/04/23 1350 109 lb 8.8 oz (49.7 kg)     Height --  Head Circumference --      Peak Flow --      Pain Score 12/04/23 1350 7     Pain Loc --      Pain Education --      Exclude from Growth Chart --    No data found.  Updated Vital Signs BP 114/75 (BP Location: Right Arm)   Pulse 79   Temp 98.9 F (37.2 C) (Oral)   Resp 16   Wt 109 lb 8.8 oz (49.7 kg)   SpO2 97%   BMI 19.41 kg/m   Visual Acuity Right Eye Distance:   Left Eye Distance:   Bilateral Distance:    Right Eye Near:   Left Eye Near:    Bilateral Near:     Physical Exam Vitals reviewed.  Constitutional:      Appearance: He is underweight.  HENT:     Head: Normocephalic and atraumatic.  Eyes:     Extraocular Movements: Extraocular movements intact.     Pupils: Pupils are equal, round, and reactive to light.  Cardiovascular:     Rate and Rhythm: Normal rate and regular rhythm.  Pulmonary:     Effort: Pulmonary effort is normal.     Breath sounds: Normal breath sounds.  Abdominal:     General: Abdomen is flat. Bowel sounds are normal. There is no distension or abdominal bruit.     Palpations: Abdomen is soft.     Tenderness: There is generalized abdominal tenderness (No reproducible abdominal pain with palpation).  Musculoskeletal:        General:  Normal range of motion.     Cervical back: Normal range of motion and neck supple.  Skin:    General: Skin is warm.  Neurological:     Mental Status: He is alert.      UC Treatments / Results  Labs (all labs ordered are listed, but only abnormal results are displayed) Labs Reviewed - No data to display  EKG   Radiology No results found.  Procedures Procedures (including critical care time)  Medications Ordered in UC Medications - No data to display  Initial Impression / Assessment and Plan / UC Course  I have reviewed the triage vital signs and the nursing notes.  Pertinent labs & imaging results that were available during my care of the patient were reviewed by me and considered in my medical decision making (see chart for details).   Discussed with patient symptom management with Reglan and famotidine for management of GI symptoms and nausea.  Asked with patient to contact his prescriber of ADHD medication as he may require a medication adjustment either in dose or changing medication altogether.  Meantime advised to treat current symptoms as needed with famotidine 20 mg before meal and Reglan as needed for nausea.  Low suspicion for an acute abdomen as symptoms have been ongoing for over 3 weeks and reproducible abdominal tenderness present on exam.  Patient verbalizes that he will follow-up with his ADHD medication prescriber today to discuss the options. Final Clinical Impressions(s) / UC Diagnoses   Final diagnoses:  Generalized abdominal pain  Postprandial nausea  Side effect of medication     Discharge Instructions      Contact your prescriber for your ADHD medications to discuss other options for treatment given the symptoms you have been experiencing since starting this medication a month ago.  Meantime I am placing you on famotidine take 1 tablet prior to breakfast and dinner as needed to help with  settling your stomach and prevent you from having the urge to  vomit.  For the acute symptoms of nausea Reglan you may take this medication up to 3 times daily as needed for symptoms of nausea and it also can be taken with the famotidine immediately before meals to prevent the sensation of eating to regurgitate after meals.     ED Prescriptions     Medication Sig Dispense Auth. Provider   famotidine (PEPCID) 20 MG tablet Take 1 tablet (20 mg total) by mouth 2 (two) times daily as needed for heartburn or indigestion (take before meals as needed). 30 tablet Buena Carmine, NP   metoCLOPramide (REGLAN) 10 MG tablet Take 1 tablet (10 mg total) by mouth every 8 (eight) hours as needed for nausea. 20 tablet Buena Carmine, NP      PDMP not reviewed this encounter.   Buena Carmine, NP 12/04/23 1530

## 2023-12-24 ENCOUNTER — Emergency Department (HOSPITAL_COMMUNITY)

## 2023-12-24 ENCOUNTER — Emergency Department (HOSPITAL_COMMUNITY)
Admission: EM | Admit: 2023-12-24 | Discharge: 2023-12-24 | Disposition: A | Discharge status: Home / Self Care | Attending: Emergency Medicine | Admitting: Emergency Medicine

## 2023-12-24 ENCOUNTER — Other Ambulatory Visit: Payer: Self-pay

## 2023-12-24 DIAGNOSIS — R519 Headache, unspecified: Secondary | ICD-10-CM | POA: Diagnosis not present

## 2023-12-24 DIAGNOSIS — M549 Dorsalgia, unspecified: Secondary | ICD-10-CM | POA: Insufficient documentation

## 2023-12-24 DIAGNOSIS — M542 Cervicalgia: Secondary | ICD-10-CM | POA: Diagnosis present

## 2023-12-24 DIAGNOSIS — R590 Localized enlarged lymph nodes: Secondary | ICD-10-CM | POA: Diagnosis not present

## 2023-12-24 DIAGNOSIS — Z5321 Procedure and treatment not carried out due to patient leaving prior to being seen by health care provider: Secondary | ICD-10-CM | POA: Insufficient documentation

## 2023-12-24 NOTE — ED Triage Notes (Addendum)
 Pt came in for right sided neck pain that been happening for a week. Pt states "I feel my vein popping out and I think my lymph nodes are swollen". Pt also c/o headache. Pt had steroid shots on Wednesday for back pain as well.

## 2023-12-24 NOTE — Discharge Instructions (Addendum)
 You have requested to be discharged from the ED. You are felt entirely stable to go home and are encouraged to follow up in the outpatient setting with [facility], or with [facility], to continue evaluation of your swollen lymph nodes and sense of neck fullness.
